# Patient Record
Sex: Female | Born: 1995
Health system: Southern US, Community
[De-identification: ages and names within clinical notes are randomized; demographics above are authoritative.]

## PROBLEM LIST (undated history)

## (undated) HISTORY — PX: TONSILLECTOMY: SUR1361

## (undated) HISTORY — PX: TYMPANOPLASTY: SHX33

## (undated) HISTORY — PX: TYMPANOSTOMY TUBE PLACEMENT: SHX32

## (undated) HISTORY — PX: ADENOIDECTOMY: SHX5191

---
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
## 2016-02-10 DIAGNOSIS — J09X2 Influenza due to identified novel influenza A virus with other respiratory manifestations: Secondary | ICD-10-CM | POA: Diagnosis not present

## 2016-02-29 DIAGNOSIS — Z01419 Encounter for gynecological examination (general) (routine) without abnormal findings: Secondary | ICD-10-CM | POA: Diagnosis not present

## 2016-02-29 DIAGNOSIS — Z6825 Body mass index (BMI) 25.0-25.9, adult: Secondary | ICD-10-CM | POA: Diagnosis not present

## 2016-07-21 DIAGNOSIS — N3000 Acute cystitis without hematuria: Secondary | ICD-10-CM | POA: Diagnosis not present

## 2016-07-22 DIAGNOSIS — N390 Urinary tract infection, site not specified: Secondary | ICD-10-CM | POA: Diagnosis not present

## 2016-08-11 DIAGNOSIS — Z309 Encounter for contraceptive management, unspecified: Secondary | ICD-10-CM | POA: Diagnosis not present

## 2016-08-11 DIAGNOSIS — B373 Candidiasis of vulva and vagina: Secondary | ICD-10-CM | POA: Diagnosis not present

## 2016-08-21 DIAGNOSIS — Z3202 Encounter for pregnancy test, result negative: Secondary | ICD-10-CM | POA: Diagnosis not present

## 2016-08-21 DIAGNOSIS — Z118 Encounter for screening for other infectious and parasitic diseases: Secondary | ICD-10-CM | POA: Diagnosis not present

## 2016-08-21 DIAGNOSIS — Z30430 Encounter for insertion of intrauterine contraceptive device: Secondary | ICD-10-CM | POA: Diagnosis not present

## 2016-08-21 DIAGNOSIS — Z113 Encounter for screening for infections with a predominantly sexual mode of transmission: Secondary | ICD-10-CM | POA: Diagnosis not present

## 2016-09-14 DIAGNOSIS — J029 Acute pharyngitis, unspecified: Secondary | ICD-10-CM | POA: Diagnosis not present

## 2016-09-22 DIAGNOSIS — Z30431 Encounter for routine checking of intrauterine contraceptive device: Secondary | ICD-10-CM | POA: Diagnosis not present

## 2018-04-14 ENCOUNTER — Emergency Department (HOSPITAL_COMMUNITY)
Admission: EM | Admit: 2018-04-14 | Discharge: 2018-04-14 | Disposition: A | Discharge status: Home / Self Care | Payer: BLUE CROSS/BLUE SHIELD | Attending: Emergency Medicine | Admitting: Emergency Medicine

## 2018-04-14 ENCOUNTER — Emergency Department (HOSPITAL_COMMUNITY): Payer: BLUE CROSS/BLUE SHIELD

## 2018-04-14 ENCOUNTER — Other Ambulatory Visit: Payer: Self-pay

## 2018-04-14 DIAGNOSIS — R42 Dizziness and giddiness: Secondary | ICD-10-CM | POA: Diagnosis not present

## 2018-04-14 DIAGNOSIS — R079 Chest pain, unspecified: Secondary | ICD-10-CM | POA: Diagnosis not present

## 2018-04-14 DIAGNOSIS — R1084 Generalized abdominal pain: Secondary | ICD-10-CM | POA: Diagnosis not present

## 2018-04-14 DIAGNOSIS — R1013 Epigastric pain: Secondary | ICD-10-CM | POA: Diagnosis not present

## 2018-04-14 DIAGNOSIS — R0602 Shortness of breath: Secondary | ICD-10-CM | POA: Diagnosis not present

## 2018-04-14 LAB — BASIC METABOLIC PANEL
Anion gap: 10 (ref 5–15)
BUN: 14 mg/dL (ref 6–20)
CO2: 20 mmol/L — ABNORMAL LOW (ref 22–32)
Calcium: 9 mg/dL (ref 8.9–10.3)
Chloride: 106 mmol/L (ref 98–111)
Creatinine, Ser: 0.9 mg/dL (ref 0.44–1.00)
GFR calc Af Amer: >60 mL/min (ref >60)
GFR calc non Af Amer: >60 mL/min (ref >60)
Glucose, Bld: 87 mg/dL (ref 70–99)
Potassium: 3.9 mmol/L (ref 3.5–5.1)
Sodium: 136 mmol/L (ref 135–145)

## 2018-04-14 LAB — URINALYSIS, ROUTINE W REFLEX MICROSCOPIC
Bilirubin Urine: NEGATIVE
Glucose, UA: NEGATIVE mg/dL
Ketones, ur: 80 mg/dL — AB
Leukocytes,Ua: NEGATIVE
Nitrite: POSITIVE — AB
Protein, ur: NEGATIVE mg/dL
Specific Gravity, Urine: 1.019 (ref 1.005–1.030)
pH: 6 (ref 5.0–8.0)

## 2018-04-14 LAB — I-STAT TROPONIN, ED: Troponin i, poc: 0.01 ng/mL (ref 0.00–0.08)

## 2018-04-14 LAB — CBC
HCT: 44.1 % (ref 36.0–46.0)
Hemoglobin: 14.6 g/dL (ref 12.0–15.0)
MCH: 29.1 pg (ref 26.0–34.0)
MCHC: 33.1 g/dL (ref 30.0–36.0)
MCV: 87.8 fL (ref 80.0–100.0)
Platelets: 247 10*3/uL (ref 150–400)
RBC: 5.02 MIL/uL (ref 3.87–5.11)
RDW: 12 % (ref 11.5–15.5)
WBC: 11.2 10*3/uL — ABNORMAL HIGH (ref 4.0–10.5)
nRBC: 0 % (ref 0.0–0.2)

## 2018-04-14 LAB — HEPATIC FUNCTION PANEL
ALT: 18 U/L (ref 0–44)
AST: 15 U/L (ref 15–41)
Albumin: 4.4 g/dL (ref 3.5–5.0)
Alkaline Phosphatase: 49 U/L (ref 38–126)
Bilirubin, Direct: 0.2 mg/dL (ref 0.0–0.2)
Indirect Bilirubin: 1.2 mg/dL — ABNORMAL HIGH (ref 0.3–0.9)
Total Bilirubin: 1.4 mg/dL — ABNORMAL HIGH (ref 0.3–1.2)
Total Protein: 6.9 g/dL (ref 6.5–8.1)

## 2018-04-14 LAB — I-STAT BETA HCG BLOOD, ED (MC, WL, AP ONLY): I-stat hCG, quantitative: <5 m[IU]/mL (ref <5)

## 2018-04-14 LAB — D-DIMER, QUANTITATIVE: D-Dimer, Quant: <0.27 ug/mL-FEU (ref 0.00–0.50)

## 2018-04-14 LAB — LIPASE, BLOOD: Lipase: 24 U/L (ref 11–51)

## 2018-04-14 MED ORDER — ONDANSETRON HCL 4 MG PO TABS: 4.0000 mg | ORAL_TABLET | Freq: Three times a day (TID) | ORAL | 0 refills | Status: DC | PRN

## 2018-04-14 MED ORDER — ALUM & MAG HYDROXIDE-SIMETH 200-200-20 MG/5ML PO SUSP
30.0000 mL | Freq: Once | ORAL | Status: AC
  Administered 2018-04-14: 30 mL via ORAL
  Filled 2018-04-14: qty 30

## 2018-04-14 MED ORDER — ONDANSETRON HCL 4 MG/2ML IJ SOLN
4.0000 mg | Freq: Once | INTRAMUSCULAR | Status: AC
  Administered 2018-04-14: 4 mg via INTRAVENOUS
  Filled 2018-04-14: qty 2

## 2018-04-14 MED ORDER — SODIUM CHLORIDE 0.9 % IV BOLUS
1000.0000 mL | Freq: Once | INTRAVENOUS | Status: AC
  Administered 2018-04-14: 1000 mL via INTRAVENOUS

## 2018-04-14 MED ORDER — KETOROLAC TROMETHAMINE 30 MG/ML IJ SOLN
30.0000 mg | Freq: Once | INTRAMUSCULAR | Status: AC
  Administered 2018-04-14: 30 mg via INTRAVENOUS
  Filled 2018-04-14: qty 1

## 2018-04-14 NOTE — ED Notes (Signed)
Patient verbalized understanding of discharge instructions. Opportunities for questioning and answers were provided. Armband removed by staff. Patient removed from ED.   

## 2018-04-14 NOTE — Discharge Instructions (Addendum)
You have been seen today for chest pain. Please read and follow all provided instructions.   1. Medications: zofran for nausea, usual home medications 2. Treatment: rest, drink plenty of fluids, stay home and avoid sick exposures 3. Follow Up: Please follow up with your primary doctor in 2-5 days for discussion of your diagnoses and further evaluation after today's visit; if you do not have a primary care doctor use the resource guide provided to find one; Please return to the ER for any new or worsening symptoms. Please obtain all of your results from medical records or have your doctors office obtain the results - share them with your doctor - you should be seen at your doctors office. Call today to arrange your follow up.   Take medications as prescribed. Please review all of the medicines and only take them if you do not have an allergy to them. Return to the emergency room for worsening condition or new concerning symptoms. Follow up with your regular doctor. If you don't have a regular doctor use one of the numbers below to establish a primary care doctor.  Please be aware that if you are taking birth control pills, taking other prescriptions, ESPECIALLY ANTIBIOTICS may make the birth control ineffective - if this is the case, either do not engage in sexual activity or use alternative methods of birth control such as condoms until you have finished the medicine and your family doctor says it is OK to restart them. If you are on a blood thinner such as COUMADIN, be aware that any other medicine that you take may cause the coumadin to either work too much, or not enough - you should have your coumadin level rechecked in next 7 days if this is the case.  ?  It is also a possibility that you have an allergic reaction to any of the medicines that you have been prescribed - Everybody reacts differently to medications and while MOST people have no trouble with most medicines, you may have a reaction such as  nausea, vomiting, rash, swelling, shortness of breath. If this is the case, please stop taking the medicine immediately and contact your physician.  ?  You should return to the ER if you develop severe or worsening symptoms.   Emergency Department Resource Guide 1) Find a Doctor and Pay Out of Pocket Although you won't have to find out who is covered by your insurance plan, it is a good idea to ask around and get recommendations. You will then need to call the office and see if the doctor you have chosen will accept you as a new patient and what types of options they offer for patients who are self-pay. Some doctors offer discounts or will set up payment plans for their patients who do not have insurance, but you will need to ask so you aren't surprised when you get to your appointment.  2) Contact Your Local Health Department Not all health departments have doctors that can see patients for sick visits, but many do, so it is worth a call to see if yours does. If you don't know where your local health department is, you can check in your phone book. The CDC also has a tool to help you locate your state's health department, and many state websites also have listings of all of their local health departments.  3) Find a Walk-in Clinic If your illness is not likely to be very severe or complicated, you may want to try a  walk in clinic. These are popping up all over the country in pharmacies, drugstores, and shopping centers. They're usually staffed by nurse practitioners or physician assistants that have been trained to treat common illnesses and complaints. They're usually fairly quick and inexpensive. However, if you have serious medical issues or chronic medical problems, these are probably not your best option.  No Primary Care Doctor: Call Health Connect at  [phone] - they can help you locate a primary care doctor that  accepts your insurance, provides certain services, etc. Physician Referral  Service- [phone]  Chronic Pain Problems: Organization         Address  Phone   Notes  [facility]  [phone] Patients need to be referred by their primary care doctor.   Medication Assistance: Organization         Address  Phone   Notes  [facility] Medication [facility] [address] [place], [place] [address] [phone] --Must be a resident of [facility] -- Must have NO insurance coverage whatsoever (no Medicaid/ Medicare, etc.) -- The pt. MUST have a primary care doctor that directs their care regularly and follows them in the community   [facility]  [phone]   [employer]  [phone]    Agencies that provide inexpensive medical care: Organization         Address  Phone   Notes  [place]  [phone]   [facility]    [phone]   [facility] [place], [place] [address] [phone]   [place] [address] [place] [phone]   [facility]    [phone]   [facility]    [phone]   [place] [address] [place] [address], [place] Phone:  [phone], Fax:  [phone] Hours of Operation:  9 am - 6 pm, M-F.  Also accepts Medicaid/Medicare and self-pay.  [facility] for [address], [address], [place] Phone: [phone], Fax: [phone]. Hours of Operation:  8:30 am - 5:30 pm, M-F.  Also accepts Medicaid and self-pay.  [facility] [address] [place] Phone: [phone]   [place], [place], [place] [phone], [address]. [phone] Mondays & Thursdays: 7-9 AM.  First 15 patients are seen on a first come, first serve basis.    [place] Providers:  Organization         Address  Phone   Notes  [facility] [address], [place] [phone] Also accepts self-pay patients.  [facility] [address], [place]  [phone]   [place], [address], [place] [phone]   [facility] [address], [place] [phone]   Lucianne Lei [address], [place]   [phone] Only accepts [place] [facility] [place] patients after they have their name applied to their card.   Self-Pay (no insurance) in [facility]:  Organization         Address  Phone   Notes  Sickle Cell Patients, [facility] [place] [phone]   [facility] [place] [phone]   [facility] [place]  [address], [address], [place] [phone]   [facility]/Dr. Osei-Bonsu  [address] [place] or [address], [place] [phone] Phone number for both [place] and [place] locations is the same.  [facility] [address] [place] [phone]   [facility] [address], [place] or [address] [phone]   [facility] [address] [place] [phone], phone; [phone], fax Sees patients 1st and 3rd Saturday of every month.  Must not qualify for public or private insurance (i.e. Medicaid, Medicare, [place] [employer] Choice, Veterans' Benefits)  Household income should be no more than 200% of the poverty level The clinic cannot treat you if you are pregnant or think you are pregnant  Sexually transmitted diseases are not treated at the clinic.

## 2018-04-14 NOTE — ED Notes (Signed)
Patient ambulated past the nurse's station and back to her room with steady gait and no complaints of dizziness or lightheadedness.

## 2018-04-14 NOTE — ED Triage Notes (Signed)
Pt here via EMS for evaluation of chest pain. Last night, pt had upset stomach, woke up feeling nauseas today with sternal chest pain. Two episodes of vomiting, with worsening chest, back, and side pain. Chest pain worse with movement and deep breath and causes shob. Pt dizzy when standing, orthostatic changes: 106/59 sitting HR 95 , 95/50 HR 140 standing.

## 2018-04-14 NOTE — ED Provider Notes (Signed)
[facility] EMERGENCY DEPARTMENT Provider Note   CSN: [id number] Arrival date & time: 04/14/18  1553    History   Chief Complaint Chief Complaint  Patient presents with  . Chest Pain    HPI Meghan Shelton is a 23 y.o. female with no significant past medical history presents with constant middle non radiating chest pain onset today at 11:30am. Patient reports she had mild epigastric abdominal pain yesterday after eating a barbeque. Patient reports she woke up with worse abdominal pain, nausea, and two episodes of vomiting. Patient reports chest pain started after vomiting. Patient states chest pain is worse with deep breaths and movement. Patient reports dizziness that she describes as lightheadedness with standing. Patient denies fever, chills, cough, congestion, rhinorrhea, or sick contacts. Patient denies diarrhea and states last BM was yesterday and it was normal. Patient reports she has a Mirena IUD. Patient denies recent travel, recent surgery, leg edema/pain, or hx of DVT/PE. Patient denies abdominal surgeries. Patient denies a cardiac history. Patient denies a family history of sudden cardiac death.  LMP at the beginning of February. Patient denies vaginal discharge or concerns for STIs. Patient reports occasional alcohol use, but denies tobacco or drug use.    HPI  No past medical history on file.  There are no active problems to display for this patient.   Past Surgical History:  Procedure Laterality Date  . ADENOIDECTOMY    . TONSILLECTOMY    . TYMPANOPLASTY    . TYMPANOSTOMY TUBE PLACEMENT       OB History   No obstetric history on file.      Home Medications    Prior to Admission medications   Medication Sig Start Date End Date Taking? Authorizing Provider  ondansetron (ZOFRAN) 4 MG tablet Take 1 tablet (4 mg total) by mouth every 8 (eight) hours as needed for nausea or vomiting. 04/14/18   Leretha Dykes, PA-C    Family History No  family history on file.  Social History Social History   Tobacco Use  . Smoking status: Never Smoker  Substance Use Topics  . Alcohol use: Not on file  . Drug use: Not on file     Allergies   Patient has no known allergies.   Review of Systems Review of Systems  Constitutional: Negative for activity change, appetite change, chills, diaphoresis, fatigue, fever and unexpected weight change.  HENT: Negative for congestion and rhinorrhea.   Respiratory: Negative for cough, chest tightness, shortness of breath and wheezing.   Cardiovascular: Positive for chest pain. Negative for palpitations and leg swelling.  Gastrointestinal: Positive for abdominal pain, nausea and vomiting. Negative for constipation and diarrhea.  Endocrine: Negative for cold intolerance and heat intolerance.  Genitourinary: Negative for dysuria, vaginal bleeding and vaginal discharge.  Musculoskeletal: Negative for back pain.  Skin: Negative for rash.  Allergic/Immunologic: Negative for immunocompromised state.  Neurological: Positive for dizziness and light-headedness. Negative for syncope, weakness, numbness and headaches.  Psychiatric/Behavioral: Negative for agitation and behavioral problems. The patient is not nervous/anxious.      Physical Exam Updated Vital Signs BP (!) 112/49   Pulse 93   Temp 99.5 F (37.5 C) (Oral)   Resp 13   Ht  (1.6 m)   Wt 60.3 kg   SpO2 98%   BMI 23.56 kg/m   Physical Exam Vitals signs and nursing note reviewed.  Constitutional:      General: She is not in acute distress.    Appearance: She  is well-developed. She is not diaphoretic.  HENT:     Head: Normocephalic and atraumatic.  Neck:     Musculoskeletal: Normal range of motion and neck supple.     Vascular: No JVD.  Cardiovascular:     Rate and Rhythm: Normal rate and regular rhythm.     Pulses: Normal pulses.          Radial pulses are 2+ on the right side and 2+ on the left side.       Dorsalis pedis  pulses are 2+ on the right side and 2+ on the left side.     Heart sounds: Normal heart sounds. No murmur. No friction rub. No gallop.   Pulmonary:     Effort: Pulmonary effort is normal. No respiratory distress.     Breath sounds: Normal breath sounds. No wheezing, rhonchi or rales.  Chest:     Chest wall: No tenderness.  Abdominal:     General: Abdomen is flat.     Palpations: Abdomen is soft.     Tenderness: There is no abdominal tenderness. There is no right CVA tenderness, left CVA tenderness, guarding or rebound. Negative signs include Murphy's sign.  Musculoskeletal: Normal range of motion.     Right lower leg: She exhibits no tenderness. No edema.     Left lower leg: She exhibits no tenderness. No edema.  Skin:    General: Skin is warm.     Capillary Refill: Capillary refill takes less than 2 seconds.     Coloration: Skin is not pale.     Findings: No rash.  Neurological:     Mental Status: She is alert and oriented to person, place, and time.   Mental Status:  Alert, oriented, thought content appropriate, able to give a coherent history. Speech fluent without evidence of aphasia. Able to follow 2 step commands without difficulty.  Cranial Nerves:  II:  Peripheral visual fields grossly normal, pupils equal, round, reactive to light III,IV, VI: ptosis not present, extra-ocular motions intact bilaterally  V,VII: smile symmetric, facial light touch sensation equal VIII: hearing grossly normal to voice  X: uvula elevates symmetrically  XI: bilateral shoulder shrug symmetric and strong XII: midline tongue extension without fassiculations Motor:  Normal tone. 5/5 in upper and lower extremities bilaterally including strong and equal grip strength and dorsiflexion/plantar flexion Sensory: light touch normal in all extremities.  Deep Tendon Reflexes: 2+ and symmetric in the biceps and patella Cerebellar: normal finger-to-nose with bilateral upper extremities Gait: normal gait and  balance.  CV: distal pulses palpable throughout    ED Treatments / Results  Labs (all labs ordered are listed, but only abnormal results are displayed) Labs Reviewed  BASIC METABOLIC PANEL - Abnormal; Notable for the following components:      Result Value   CO2 20 (*)    All other components within normal limits  CBC - Abnormal; Notable for the following components:   WBC 11.2 (*)    All other components within normal limits  URINALYSIS, ROUTINE W REFLEX MICROSCOPIC - Abnormal; Notable for the following components:   APPearance HAZY (*)    Hgb urine dipstick MODERATE (*)    Ketones, ur 80 (*)    Nitrite POSITIVE (*)    Bacteria, UA RARE (*)    All other components within normal limits  HEPATIC FUNCTION PANEL - Abnormal; Notable for the following components:   Total Bilirubin 1.4 (*)    Indirect Bilirubin 1.2 (*)    All other  components within normal limits  URINE CULTURE  D-DIMER, QUANTITATIVE (NOT AT [facility])  LIPASE, BLOOD  I-STAT TROPONIN, ED  I-STAT BETA HCG BLOOD, ED ([facility], [facility], [facility] ONLY)    EKG EKG Interpretation  Date/Time:  Sunday April 14 2018 16:00:33 EDT Ventricular Rate:  89 PR Interval:    QRS Duration: 84 QT Interval:  367 QTC Calculation: 447 R Axis:   95 Text Interpretation:  Sinus rhythm Biatrial enlargement Consider right ventricular hypertrophy Confirmed by Tilden Fossa [phone]) on 04/14/2018 5:28:23 PM   Radiology Dg Chest 2 View  Result Date: 04/14/2018 CLINICAL DATA:  Chest pain with some shortness of breath for a few days. EXAM: CHEST - 2 VIEW COMPARISON:  None. FINDINGS: Cardiomediastinal silhouette is within normal limits in size and configuration. Lungs are clear. Lung volumes are normal. No evidence of pneumonia. No pleural effusion. No pneumothorax seen. Osseous structures about the chest are unremarkable. IMPRESSION: Normal chest x-ray.  No evidence of pneumonia. Electronically Signed   By: Bary Richard M.D.   On: 04/14/2018 17:03     Procedures Procedures (including critical care time)  Medications Ordered in ED Medications  ondansetron (ZOFRAN) injection 4 mg (4 mg Intravenous Given 04/14/18 1644)  sodium chloride 0.9 % bolus 1,000 mL (0 mLs Intravenous Stopped 04/14/18 1728)  alum & mag hydroxide-simeth (MAALOX/MYLANTA) 200-200-20 MG/5ML suspension 30 mL (30 mLs Oral Given 04/14/18 1727)  ketorolac (TORADOL) 30 MG/ML injection 30 mg (30 mg Intravenous Given 04/14/18 1922)     Initial Impression / Assessment and Plan / ED Course  I have reviewed the triage vital signs and the nursing notes.  Pertinent labs & imaging results that were available during my care of the patient were reviewed by me and considered in my medical decision making (see chart for details).  Clinical Course as of Apr 13 2025  Sun Apr 14, 2018  1706 No cardiopulmonary disease noted on CXR.  DG Chest 2 View [AH]  1813 UA reveals Hgb, ketones, nitrites, and rare bacteria. Will send for a urine culture.  Urinalysis, Routine w reflex microscopic(!) [AH]  1814 WBCs elevated at 11.2.  WBC(!): 11.2 [AH]  1854 Patient reports abdominal pain and chest pain have improved.    [AH]  2009 Patient was able to ambulate without difficulty around the ER. Patient denies dizziness and states symptoms have improved.   [AH]    Clinical Course User Index [AH] Leretha Dykes, PA-C      Patient is to be discharged with recommendation to follow up with PCP in regards to today's hospital visit. Chest pain is not likely of cardiac or pulmonary etiology d/t presentation, d dimer negative, VSS, no tracheal deviation, no JVD or new murmur, RRR, breath sounds equal bilaterally, EKG without acute abnormalities, negative troponin, and negative CXR. Pt has been advised to return to the ED if CP becomes exertional, associated with diaphoresis or nausea, radiates to left jaw/arm, worsens or becomes concerning in any way. Pt appears reliable for follow up and is agreeable to  discharge.   Patient is nontoxic, nonseptic appearing, in no apparent distress.  Patient's pain and other symptoms adequately managed in emergency department.  Fluid bolus given.  Labs, imaging and vitals reviewed.  Patient does not meet the SIRS or Sepsis criteria.  On repeat exam patient does not have a surgical abdomin and there are no peritoneal signs. Patient does not have any tenderness on exam. No indication of appendicitis, bowel obstruction, bowel perforation, cholecystitis, diverticulitis, PID or ectopic  pregnancy.  Suspect patient has a viral illness. Patient discharged home with symptomatic treatment and given strict instructions for follow-up with their primary care physician.  I have also discussed reasons to return immediately to the ER.  Patient expresses understanding and agrees with plan.   Case has been discussed with Dr. Madilyn Hook who agrees with the above plan to discharge.   Final Clinical Impressions(s) / ED Diagnoses   Final diagnoses:  Nonspecific chest pain  Epigastric pain    ED Discharge Orders         Ordered    ondansetron (ZOFRAN) 4 MG tablet  Every 8 hours PRN     04/14/18 2026           Leretha Dykes, [place] 04/14/18 2028    Tilden Fossa, MD 04/14/18 [phone]

## 2018-04-14 NOTE — ED Notes (Signed)
Called lab about adding on Hepatic function panel from previously sent light green top. Told I did not need to send the order requisition to the lab.

## 2018-04-17 LAB — URINE CULTURE: Culture: >=100000 — AB

## 2018-04-18 ENCOUNTER — Telehealth: Payer: Self-pay | Admitting: *Deleted

## 2018-04-18 NOTE — Telephone Encounter (Signed)
Post ED Visit - Positive Culture Follow-up  Culture report reviewed by antimicrobial stewardship pharmacist: [facility] []  Enzo Bi, Pharm.D. []  Celedonio Miyamoto, Pharm.D., BCPS AQ-ID []  Garvin Fila, Pharm.D., BCPS []  Georgina Pillion, Pharm.D., BCPS []  [place], [address].D., BCPS, AAHIVP []  Estella Husk, Pharm.D., BCPS, AAHIVP []  Lysle Pearl, PharmD, BCPS []  Phillips Climes, PharmD, BCPS [x]  Agapito Games, PharmD, BCPS []  Verlan Friends, PharmD []  Mervyn Gay, PharmD, BCPS []  Vinnie Level, PharmD  [facility] []  Len Childs, PharmD []  Greer Pickerel, PharmD []  Adalberto Cole, PharmD []  Perlie Gold, Rph []  Lonell Face) Jean Rosenthal, PharmD []  Earl Many, PharmD []  Junita Push, PharmD []  Dorna Leitz, PharmD []  Terrilee Files, PharmD []  Lynann Beaver, PharmD []  Keturah Barre, PharmD []  Loralee Pacas, PharmD []  Bernadene Person, PharmD   Positive urine culture GU symptoms negative, UA with +nitrite, otherwise negative and no further patient follow-up is required at this time.  Virl [facility] 04/18/2018, 8:04 AM

## 2018-09-05 ENCOUNTER — Ambulatory Visit (INDEPENDENT_AMBULATORY_CARE_PROVIDER_SITE_OTHER): Payer: BC Managed Care – PPO | Admitting: Allergy & Immunology

## 2018-09-05 ENCOUNTER — Other Ambulatory Visit: Payer: Self-pay

## 2018-09-05 ENCOUNTER — Encounter: Payer: Self-pay | Admitting: Allergy & Immunology

## 2018-09-05 VITALS — BP 98/60 | HR 97 | Temp 98.8°F | Resp 16 | Ht 63.0 in | Wt 132.2 lb

## 2018-09-05 DIAGNOSIS — J301 Allergic rhinitis due to pollen: Secondary | ICD-10-CM

## 2018-09-05 DIAGNOSIS — L508 Other urticaria: Secondary | ICD-10-CM

## 2018-09-05 NOTE — Patient Instructions (Addendum)
1. Chronic urticaria - Testing today was positive to grasses as well as one outdoor mold. - The grasses can certainly explain the hives, as they typically occur when you are outdoors. - However, we are going to get some blood work to make sure there are no other causes of your hives. - We will get some labs to rule out serious causes of hives: complete blood count, tryptase level, chronic urticaria panel, CMP, ESR, and CRP. We are also going to get an alpha gal panel and thyroid studies.  - Your history does not have any "red flags" such as fevers, joint pains, or permanent skin changes that would be concerning for a more serious cause of hives.  - Chronic hives are often times a self limited process and will "burn themselves out" over 6-12 months, although this is not always the case.  - In the meantime, start suppressive dosing of antihistamines:   - Morning: Zyrtec (cetirizine) 87m (one tablet) EVERY DAY  - Evening: Zyrtec (cetirizine) 156m(one tablet) IF NEEDED - You can change this dosing at home, decreasing the dose as needed or increasing the dosing as needed.  - If you are not tolerating the medications or are tired of taking them every day, we can start treatment with a monthly injectable medication called Xolair.   2. Return in about 8 weeks (around 10/31/2018). This can be an in-person, a virtual Webex or a telephone follow up visit.   Please inform usKoreaf any Emergency Department visits, hospitalizations, or changes in symptoms. Call usKoreaefore going to the ED for breathing or allergy symptoms since we might be able to fit you in for a sick visit. Feel free to contact usKoreanytime with any questions, problems, or concerns.  It was a pleasure to meet you today!  Websites that have reliable patient information: 1. American Academy of Asthma, Allergy, and Immunology: www.aaaai.org 2. Food Allergy Research and Education (FARE): foodallergy.org 3. Mothers of Asthmatics:  http://www.asthmacommunitynetwork.org 4. American College of Allergy, Asthma, and Immunology: www.acaai.org  "Like" usKorean Facebook and Instagram for our latest updates!      Make sure you are registered to vote! If you have moved or changed any of your contact information, you will need to get this updated before voting!  In some cases, you MAY be able to register to vote online: [email or web address].[email or web address]  Voter ID laws are NOT going into effect for the General Election in November 2020! DO NOT let this stop you from exercising your right to vote!   Absentee voting is the SAFEST way to vote during the coronavirus pandemic!   Download and print an absentee ballot request form at rebrand.ly/GCO-Ballot-Request or you can scan the QR code below with your smart phone:      More information on absentee ballots can be found here: https://rebrand.ly/GCO-Absentee  Reducing Pollen Exposure  The American Academy of Allergy, Asthma and Immunology suggests the following steps to reduce your exposure to pollen during allergy seasons.    1. Do not hang sheets or clothing out to dry; pollen may collect on these items. 2. Do not mow lawns or spend time around freshly cut grass; mowing stirs up pollen. 3. Keep windows closed at night.  Keep car windows closed while driving. 4. Minimize morning activities outdoors, a time when pollen counts are usually at their highest. 5. Stay indoors as much as possible when pollen counts or humidity is high and on windy days when pollen tends to  remain in the air longer. 6. Use air conditioning when possible.  Many air conditioners have filters that trap the pollen spores. 7. Use a HEPA room air filter to remove pollen form the indoor air you breathe.

## 2018-09-05 NOTE — Progress Notes (Signed)
NEW PATIENT  Date of Service/Encounter:  09/05/18  Referring provider: Lennie Hummer, MD   Assessment:   Chronic urticaria  Plan/Recommendations:   1. Chronic urticaria - Testing today was positive to grasses as well as one outdoor mold. - The grasses can certainly explain the hives, as they typically occur when you are outdoors. - However, we are going to get some blood work to make sure there are no other causes of your hives. - We will get some labs to rule out serious causes of hives: complete blood count, tryptase level, chronic urticaria panel, CMP, ESR, and CRP. We are also going to get an alpha gal panel and thyroid studies.  - Your history does not have any "red flags" such as fevers, joint pains, or permanent skin changes that would be concerning for a more serious cause of hives.  - Chronic hives are often times a self limited process and will "burn themselves out" over 6-12 months, although this is not always the case.  - In the meantime, start suppressive dosing of antihistamines:   - Morning: Zyrtec (cetirizine) 47m (one tablet) EVERY DAY  - Evening: Zyrtec (cetirizine) 193m(one tablet) IF NEEDED - You can change this dosing at home, decreasing the dose as needed or increasing the dosing as needed.  - If you are not tolerating the medications or are tired of taking them every day, we can start treatment with a monthly injectable medication called Xolair.   2. Return in about 8 weeks (around 10/31/2018). This can be an in-person, a virtual Webex or a telephone follow up visit.   Subjective:   Meghan Shelton a [age].o. female presenting today for evaluation of  Chief Complaint  Patient presents with  . Angioedema  . Urticaria    Meghan CHARBONNEAUas a history of the following: Patient Active Problem List   Diagnosis Date Noted  . Chronic urticaria 09/05/2018  . Seasonal allergic rhinitis due to pollen 09/05/2018    History obtained from: chart review  and patient.  Meghan Sonsas referred by LoLennie HummerMD.     Meghan Shelton a [age].o. female presenting for an evaluation of urticaria. Her episode in March was after running outdoors. She developed hives near the end and then had some isolated hives over her body. She called EMS and her exam was normal other than the hives. She got Benadryl with improvement.  On Sunday, she got up in the morning and then at 6:30am she took the dogs out. Then she came in and went back to bed and develops hives her back and sides. She was outdoors only for 2-3 minutes.   [place] December [place] but she has had dogs in the past. Most episodes are associated with runs outdoors. She never had this problem one year ago but these just started in March 2020.   She did have some throat closure on Sunday. Otherwise no involvement of her throat. Her mother and sister have Hashimoto's thyroiditis. She does not have any thyroid issues at this time. She thinks that she has been tested in the past and it has been normal. She denies weight gain or weight loss and has no problems with excessive sweating.   She has never been tested for allergies. She does not get sinus infections often at all. She did play sports in high school. She is active outdoors and spends  a lot of time doing various activities outdoors. She does report some postnasal drip as well, treated with the occasional antihistamine.   Otherwise, there is no history of other atopic diseases, including food allergies, drug allergies, stinging insect allergies, eczema or contact dermatitis. There is no significant infectious history. Vaccinations are up to date.    Past Medical History: Patient Active Problem List   Diagnosis Date Noted  . Chronic urticaria 09/05/2018  . Seasonal allergic rhinitis due to pollen 09/05/2018    Medication List:  Allergies as of 09/05/2018   No Known Allergies      Medication List       Accurate as of September 05, 2018  8:57 PM. If you have any questions, ask your nurse or doctor.        STOP taking these medications   ondansetron 4 MG tablet Commonly known as: ZOFRAN Stopped by: [name], MD     TAKE these medications   diphenhydrAMINE 25 MG tablet Commonly known as: BENADRYL Take 25 mg by mouth every 6 (six) hours as needed.       Birth History: non-contributory  Developmental History: non-contributory  Past Surgical History: Past Surgical History:  Procedure Laterality Date  . ADENOIDECTOMY    . TONSILLECTOMY    . TYMPANOPLASTY    . TYMPANOSTOMY TUBE PLACEMENT       Family History: History reviewed. No pertinent family history.   Social History: Meghan Shelton lives at home with her family. There is one dog in the home. She was getting a degree in speech pathology, but she decided that she did not like it, therefore she quit temporarily. 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     Review of Systems  Constitutional: Negative.  Negative for fever, malaise/fatigue and weight loss.  HENT: Negative.  Negative for congestion, ear discharge and ear pain.        Positive for mild postnasal drip.  Eyes: Negative for pain, discharge and redness.  Respiratory: Negative for cough, sputum production, shortness of breath and wheezing.   Cardiovascular: Negative.  Negative for chest pain and palpitations.  Gastrointestinal: Negative for abdominal pain, heartburn, nausea and vomiting.  Skin: Positive for itching and rash.  Neurological: Negative for dizziness and headaches.  Endo/Heme/Allergies: Negative for environmental allergies. Does not bruise/bleed easily.       Objective:   Blood pressure 98/60, pulse 97, temperature 98.8 F (37.1 C), temperature source Temporal, resp. rate 16, height 5' 3"  (1.6 m), weight 132 lb 3.2 oz (60 kg), SpO2 97 %. Body mass index is 23.42 kg/m.   Physical Exam:    Physical Exam  Constitutional: She appears well-developed.  Very pleasant female. Cooperative with the exam.   HENT:  Head: Normocephalic and atraumatic.  Right Ear: Tympanic membrane, external ear and ear canal normal. No drainage, swelling or tenderness. Tympanic membrane is not injected, not scarred, not erythematous, not retracted and not bulging.  Left Ear: Tympanic membrane, external ear and ear canal normal. No drainage, swelling or tenderness. Tympanic membrane is not injected, not scarred, not erythematous, not retracted and not bulging.  Nose: Mucosal edema and rhinorrhea present. No nasal deformity or septal deviation. No epistaxis. Right sinus exhibits no maxillary sinus tenderness and no frontal sinus tenderness. Left sinus exhibits no maxillary sinus tenderness and no frontal sinus tenderness.  Mouth/Throat: Uvula is midline and oropharynx is clear and moist. Mucous membranes are not pale and not dry.  Eyes: Pupils are equal,  round, and reactive to light. Conjunctivae and EOM are normal. Right eye exhibits no chemosis and no discharge. Left eye exhibits no chemosis and no discharge. Right conjunctiva is not injected. Left conjunctiva is not injected.  Cardiovascular: Normal rate, regular rhythm and normal heart sounds.  Respiratory: Effort normal and breath sounds normal. No accessory muscle usage. No tachypnea. No respiratory distress. She has no wheezes. She has no rhonchi. She has no rales. She exhibits no tenderness.  No increased work of breathing.   GI: There is no abdominal tenderness. There is no rebound and no guarding.  Lymphadenopathy:       Head (right side): No submandibular, no tonsillar and no occipital adenopathy present.       Head (left side): No submandibular, no tonsillar and no occipital adenopathy present.    She has no cervical adenopathy.  Neurological: She is alert.  Skin: No abrasion, no petechiae and no rash noted. Rash is not papular, not vesicular and  not urticarial. No erythema. No pallor.  No dermatographism noted. There are no urticarial lesions appreciated today, although she does have some flat red macules present in some places.   Psychiatric: She has a normal mood and affect.     Diagnostic studies:     Allergy Studies:    Airborne Adult Perc - 09/05/18 1430    Time Antigen Placed  0230    Allergen Manufacturer  Lavella Hammock    Location  Back    Number of Test  59    1. Control-Buffer 50% Glycerol  Negative    2. Control-Histamine 1 mg/ml  2+    3. Albumin saline  Negative    4. [place]  3+    5. [place]  3+    6. Johnson  3+    7. Kentucky Blue  3+    8. Meadow Fescue  3+    9. Perennial Rye  3+    10. Sweet Vernal  3+    11. Timothy  3+    12. Cocklebur  Negative    13. Burweed Marshelder  Negative    14. Ragweed, short  Negative    15. Ragweed, Giant  Negative    16. Plantain,  English  Negative    17. Lamb's Quarters  Negative    18. Sheep Sorrell  Negative    19. Rough Pigweed  Negative    20. Marsh Elder, Rough  Negative    21. Mugwort, Common  Negative    22. Ash mix  Negative    23. Birch mix  Negative    24. Beech American  Negative    25. Box, Elder  Negative    26. Cedar, red  Negative    27. Cottonwood, [place]  Negative    28. Elm mix  Negative    29. Hickory mix  Negative    30. Maple mix  Negative    31. Oak, [place] mix  Negative    32. Pecan Pollen  Negative    33. Pine mix  Negative    34. Sycamore Eastern  Negative    35. Newport, Black Pollen  Negative    36. Alternaria alternata  2+    37. Cladosporium Herbarum  Negative    38. Aspergillus mix  Negative    39. Penicillium mix  Negative    40. Bipolaris sorokiniana (Helminthosporium)  Negative    41. Drechslera spicifera (Curvularia)  Negative    42. Mucor plumbeus  Negative  43. Fusarium moniliforme  Negative    44. Aureobasidium pullulans (pullulara)  Negative    45. Rhizopus oryzae  Negative    46. Botrytis cinera  Negative    47.  Epicoccum nigrum  Negative    48. Phoma betae  Negative    49. Candida Albicans  Negative    50. Trichophyton mentagrophytes  Negative    51. Mite, D Farinae  5,000 AU/ml  Negative    52. Mite, D Pteronyssinus  5,000 AU/ml  Negative    53. Cat Hair 10,000 BAU/ml  Negative    54.  Dog Epithelia  Negative    55. Mixed Feathers  Negative    56. Horse Epithelia  Negative    57. Cockroach, German  Negative    58. Mouse  Negative    59. Tobacco Leaf  Negative       Allergy testing results were read and interpreted by myself, documented by clinical staff.         Salvatore Marvel, MD [facility]

## 2018-09-06 ENCOUNTER — Ambulatory Visit: Payer: BLUE CROSS/BLUE SHIELD | Admitting: Allergy

## 2018-09-11 LAB — CMP14+EGFR
ALT: 9 IU/L (ref 0–32)
AST: 11 IU/L (ref 0–40)
Albumin/Globulin Ratio: 2.7 — ABNORMAL HIGH (ref 1.2–2.2)
Albumin: 5.1 g/dL — ABNORMAL HIGH (ref 3.9–5.0)
Alkaline Phosphatase: 47 IU/L (ref 39–117)
BUN/Creatinine Ratio: 17 (ref 9–23)
BUN: 15 mg/dL (ref 6–20)
Bilirubin Total: 0.7 mg/dL (ref 0.0–1.2)
CO2: 22 mmol/L (ref 20–29)
Calcium: 10.1 mg/dL (ref 8.7–10.2)
Chloride: 102 mmol/L (ref 96–106)
Creatinine, Ser: 0.87 mg/dL (ref 0.57–1.00)
GFR calc Af Amer: 110 mL/min/{1.73_m2} (ref >59)
GFR calc non Af Amer: 96 mL/min/{1.73_m2} (ref >59)
Globulin, Total: 1.9 g/dL (ref 1.5–4.5)
Glucose: 89 mg/dL (ref 65–99)
Potassium: 4.6 mmol/L (ref 3.5–5.2)
Sodium: 141 mmol/L (ref 134–144)
Total Protein: 7 g/dL (ref 6.0–8.5)

## 2018-09-11 LAB — ALPHA-GAL PANEL
Alpha Gal IgE*: <0.1 kU/L (ref <0.10)
Beef (Bos spp) IgE: <0.1 kU/L (ref <0.35)
Class Interpretation: 0
Class Interpretation: 0
Class Interpretation: 0
Lamb/Mutton (Ovis spp) IgE: <0.1 kU/L (ref <0.35)
Pork (Sus spp) IgE: <0.1 kU/L (ref <0.35)

## 2018-09-11 LAB — THYROID ANTIBODIES
Thyroglobulin Antibody: <1 IU/mL (ref 0.0–0.9)
Thyroperoxidase Ab SerPl-aCnc: <9 IU/mL (ref 0–34)

## 2018-09-11 LAB — TRYPTASE: Tryptase: 5.2 ug/L (ref 2.2–13.2)

## 2018-09-11 LAB — C-REACTIVE PROTEIN: CRP: <1 mg/L (ref 0–10)

## 2018-09-11 LAB — SEDIMENTATION RATE: Sed Rate: 3 mm/hr (ref 0–32)

## 2018-09-11 LAB — CHRONIC URTICARIA: cu index: <2.6 (ref <10)

## 2018-09-11 LAB — ANA W/REFLEX IF POSITIVE: Anti Nuclear Antibody (ANA): NEGATIVE

## 2018-10-31 ENCOUNTER — Ambulatory Visit: Payer: BC Managed Care – PPO | Admitting: Allergy & Immunology

## 2018-12-24 ENCOUNTER — Other Ambulatory Visit: Payer: Self-pay

## 2018-12-24 DIAGNOSIS — Z20822 Contact with and (suspected) exposure to covid-19: Secondary | ICD-10-CM

## 2018-12-27 LAB — NOVEL CORONAVIRUS, NAA: SARS-CoV-2, NAA: NOT DETECTED

## 2019-03-04 DIAGNOSIS — Z03818 Encounter for observation for suspected exposure to other biological agents ruled out: Secondary | ICD-10-CM | POA: Diagnosis not present

## 2019-03-04 DIAGNOSIS — Z20828 Contact with and (suspected) exposure to other viral communicable diseases: Secondary | ICD-10-CM | POA: Diagnosis not present

## 2019-05-20 DIAGNOSIS — N6320 Unspecified lump in the left breast, unspecified quadrant: Secondary | ICD-10-CM | POA: Diagnosis not present

## 2019-05-29 DIAGNOSIS — N6489 Other specified disorders of breast: Secondary | ICD-10-CM | POA: Diagnosis not present

## 2019-06-04 DIAGNOSIS — Z01419 Encounter for gynecological examination (general) (routine) without abnormal findings: Secondary | ICD-10-CM | POA: Diagnosis not present

## 2019-06-04 DIAGNOSIS — Z6825 Body mass index (BMI) 25.0-25.9, adult: Secondary | ICD-10-CM | POA: Diagnosis not present

## 2019-12-18 DIAGNOSIS — T782XXA Anaphylactic shock, unspecified, initial encounter: Secondary | ICD-10-CM | POA: Diagnosis not present

## 2019-12-18 DIAGNOSIS — L509 Urticaria, unspecified: Secondary | ICD-10-CM | POA: Diagnosis not present

## 2019-12-18 DIAGNOSIS — R0902 Hypoxemia: Secondary | ICD-10-CM | POA: Diagnosis not present

## 2019-12-18 DIAGNOSIS — T7840XA Allergy, unspecified, initial encounter: Secondary | ICD-10-CM | POA: Diagnosis not present

## 2020-04-05 ENCOUNTER — Emergency Department (HOSPITAL_COMMUNITY)
Admission: EM | Admit: 2020-04-05 | Discharge: 2020-04-05 | Disposition: A | Discharge status: Home / Self Care | Payer: BC Managed Care – PPO | Source: Home / Self Care | Attending: Emergency Medicine | Admitting: Emergency Medicine

## 2020-04-05 DIAGNOSIS — R21 Rash and other nonspecific skin eruption: Secondary | ICD-10-CM | POA: Insufficient documentation

## 2020-04-05 DIAGNOSIS — X58XXXA Exposure to other specified factors, initial encounter: Secondary | ICD-10-CM | POA: Insufficient documentation

## 2020-04-05 DIAGNOSIS — T7840XA Allergy, unspecified, initial encounter: Secondary | ICD-10-CM

## 2020-04-05 DIAGNOSIS — T782XXA Anaphylactic shock, unspecified, initial encounter: Secondary | ICD-10-CM | POA: Diagnosis not present

## 2020-04-05 DIAGNOSIS — R197 Diarrhea, unspecified: Secondary | ICD-10-CM | POA: Insufficient documentation

## 2020-04-05 DIAGNOSIS — T7809XA Anaphylactic reaction due to other food products, initial encounter: Secondary | ICD-10-CM | POA: Diagnosis not present

## 2020-04-05 DIAGNOSIS — R110 Nausea: Secondary | ICD-10-CM | POA: Diagnosis not present

## 2020-04-05 DIAGNOSIS — Z20822 Contact with and (suspected) exposure to covid-19: Secondary | ICD-10-CM | POA: Diagnosis not present

## 2020-04-05 LAB — CBC WITH DIFFERENTIAL/PLATELET
Abs Immature Granulocytes: 0.04 10*3/uL (ref 0.00–0.07)
Basophils Absolute: 0 10*3/uL (ref 0.0–0.1)
Basophils Relative: 0 %
Eosinophils Absolute: 0.1 10*3/uL (ref 0.0–0.5)
Eosinophils Relative: 1 %
HCT: 45.6 % (ref 36.0–46.0)
Hemoglobin: 15 g/dL (ref 12.0–15.0)
Immature Granulocytes: 0 %
Lymphocytes Relative: 6 %
Lymphs Abs: 0.6 10*3/uL — ABNORMAL LOW (ref 0.7–4.0)
MCH: 30.2 pg (ref 26.0–34.0)
MCHC: 32.9 g/dL (ref 30.0–36.0)
MCV: 91.9 fL (ref 80.0–100.0)
Monocytes Absolute: 0.4 10*3/uL (ref 0.1–1.0)
Monocytes Relative: 4 %
Neutro Abs: 8.5 10*3/uL — ABNORMAL HIGH (ref 1.7–7.7)
Neutrophils Relative %: 89 %
Platelets: 207 10*3/uL (ref 150–400)
RBC: 4.96 MIL/uL (ref 3.87–5.11)
RDW: 14.1 % (ref 11.5–15.5)
WBC: 9.5 10*3/uL (ref 4.0–10.5)
nRBC: 0 % (ref 0.0–0.2)

## 2020-04-05 LAB — COMPREHENSIVE METABOLIC PANEL
ALT: 28 U/L (ref 0–44)
AST: 31 U/L (ref 15–41)
Albumin: 4 g/dL (ref 3.5–5.0)
Alkaline Phosphatase: 38 U/L (ref 38–126)
Anion gap: 8 (ref 5–15)
BUN: 14 mg/dL (ref 6–20)
CO2: 23 mmol/L (ref 22–32)
Calcium: 8.9 mg/dL (ref 8.9–10.3)
Chloride: 107 mmol/L (ref 98–111)
Creatinine, Ser: 1.1 mg/dL — ABNORMAL HIGH (ref 0.44–1.00)
GFR, Estimated: >60 mL/min (ref >60)
Glucose, Bld: 164 mg/dL — ABNORMAL HIGH (ref 70–99)
Potassium: 3.5 mmol/L (ref 3.5–5.1)
Sodium: 138 mmol/L (ref 135–145)
Total Bilirubin: 1 mg/dL (ref 0.3–1.2)
Total Protein: 6 g/dL — ABNORMAL LOW (ref 6.5–8.1)

## 2020-04-05 LAB — MAGNESIUM: Magnesium: 1.7 mg/dL (ref 1.7–2.4)

## 2020-04-05 MED ORDER — FAMOTIDINE IN NACL 20-0.9 MG/50ML-% IV SOLN
20.0000 mg | Freq: Once | INTRAVENOUS | Status: AC
  Administered 2020-04-05: 20 mg via INTRAVENOUS
  Filled 2020-04-05: qty 50

## 2020-04-05 MED ORDER — LACTATED RINGERS IV BOLUS
1000.0000 mL | Freq: Once | INTRAVENOUS | Status: AC
  Administered 2020-04-05: 1000 mL via INTRAVENOUS

## 2020-04-05 MED ORDER — METHYLPREDNISOLONE SODIUM SUCC 125 MG IJ SOLR
125.0000 mg | Freq: Once | INTRAMUSCULAR | Status: AC
  Administered 2020-04-05: 125 mg via INTRAVENOUS
  Filled 2020-04-05: qty 2

## 2020-04-05 MED ORDER — ONDANSETRON HCL 4 MG/2ML IJ SOLN
4.0000 mg | Freq: Once | INTRAMUSCULAR | Status: AC
  Administered 2020-04-05: 4 mg via INTRAVENOUS
  Filled 2020-04-05: qty 2

## 2020-04-05 NOTE — ED Triage Notes (Signed)
Pt arrived via ems due to allergic reaction. Pt states she started having shob and hives around 615pm. Pt took at home epi around 620pm. Pt no longer feels sob. Received 50mg  benadryl w/ ems.

## 2020-04-05 NOTE — ED Provider Notes (Signed)
[facility] EMERGENCY DEPARTMENT Provider Note   CSN: [id number] Arrival date & time: 04/05/20  1915     History Chief Complaint  Patient presents with  . Allergic Reaction    Meghan Shelton is a 25 y.o. female.  Patient is a 25 year old female with a history of chronic urticaria and seasonal allergic rhinitis who presents with concerns for allergic reaction.  Patient reports that she has been having diarrhea since last night.  She states that today she developed a rash over her back and due to concern for allergic reaction she self administered her EpiPen.  Patient arrives with EMS after EMS was called out to the house.  EMS gave an additional Benadryl IV in route.  Patient denies any shortness of breath, wheezing, swelling of the mouth, or swelling of the throat.  She states that she is been having diarrhea since last night around 8 PM.  She states that her stool is more watery than usual.  She denies any blood in the stool.  She has seen an allergist in the past.  She is allergic to multiple types of grasses.  Patient reports that she believes that her allergic reaction might be secondary to eating red meat.  She has been training for a bodybuilding competition and has not been eating any red meat for the past few weeks.  She states that she recently added that back into her diet since the competition ended on Friday.  She is concerned that adding all of her normal foods back into her diet has caused her allergic reaction.  Patient is not having any trouble breathing.  Patient denies chest pain.  She denies abdominal pain.  Denies nausea and vomiting.        No past medical history on file.  Patient Active Problem List   Diagnosis Date Noted  . Chronic urticaria 09/05/2018  . Seasonal allergic rhinitis due to pollen 09/05/2018    Past Surgical History:  Procedure Laterality Date  . ADENOIDECTOMY    . TONSILLECTOMY    . TYMPANOPLASTY    . TYMPANOSTOMY TUBE  PLACEMENT       OB History   No obstetric history on file.     No family history on file.  Social History   Tobacco Use  . Smoking status: Never Smoker  . Smokeless tobacco: Never Used  Vaping Use  . Vaping Use: Never used  Substance Use Topics  . Alcohol use: Yes    Comment: social  . Drug use: Never    Home Medications Prior to Admission medications   Medication Sig Start Date End Date Taking? Authorizing Provider  diphenhydrAMINE (BENADRYL) 25 MG tablet Take 25 mg by mouth every 6 (six) hours as needed.    [provider]    Allergies    Patient has no known allergies.  Review of Systems   Review of Systems  Constitutional: Negative for chills and fever.  HENT: Negative for ear pain and sore throat.   Eyes: Negative for pain and visual disturbance.  Respiratory: Negative for cough, shortness of breath and wheezing.   Cardiovascular: Negative for chest pain and palpitations.  Gastrointestinal: Positive for diarrhea. Negative for abdominal pain and vomiting.  Genitourinary: Negative for dysuria and hematuria.  Musculoskeletal: Negative for arthralgias and back pain.  Skin: Positive for rash. Negative for color change.  Neurological: Negative for seizures and syncope.  All other systems reviewed and are negative.   Physical Exam Updated Vital Signs BP  109/65   Pulse 63   Temp 97.8 F (36.6 C) (Temporal)   Resp 13   Ht 5\' 3"  (1.6 m)   Wt 54.4 kg   SpO2 98%   BMI 21.26 kg/m   Physical Exam Vitals and nursing note reviewed.  Constitutional:      General: She is not in acute distress.    Appearance: She is well-developed.  HENT:     Head: Normocephalic and atraumatic.     Right Ear: External ear normal.     Left Ear: External ear normal.     Nose: Nose normal.     Mouth/Throat:     Mouth: Mucous membranes are moist.     Pharynx: Oropharynx is clear.     Comments: No evidence for edema or swelling  Eyes:     General:        Right eye: No  discharge.        Left eye: No discharge.     Extraocular Movements: Extraocular movements intact.     Conjunctiva/sclera: Conjunctivae normal.  Cardiovascular:     Rate and Rhythm: Normal rate and regular rhythm.     Heart sounds: No murmur heard.   Pulmonary:     Effort: Pulmonary effort is normal. No respiratory distress.     Breath sounds: Normal breath sounds. No wheezing.  Abdominal:     Palpations: Abdomen is soft.     Tenderness: There is no abdominal tenderness.  Musculoskeletal:     Cervical back: Neck supple.  Skin:    General: Skin is warm and dry.     Findings: Rash present.     Comments: Erythematous flat rash over back, however no urticaria appreciated  Neurological:     Mental Status: She is alert.     ED Results / Procedures / Treatments   Labs (all labs ordered are listed, but only abnormal results are displayed) Labs Reviewed  COMPREHENSIVE METABOLIC PANEL - Abnormal; Notable for the following components:      Result Value   Glucose, Bld 164 (*)    Creatinine, Ser 1.10 (*)    Total Protein 6.0 (*)    All other components within normal limits  CBC WITH DIFFERENTIAL/PLATELET - Abnormal; Notable for the following components:   Neutro Abs 8.5 (*)    Lymphs Abs 0.6 (*)    All other components within normal limits  MAGNESIUM    EKG EKG Interpretation  Date/Time:  Monday April 05 2020 19:24:50 EDT Ventricular Rate:  62 PR Interval:    QRS Duration: 90 QT Interval:  417 QTC Calculation: 424 R Axis:   85 Text Interpretation: Sinus rhythm Short PR interval Confirmed by 1972) on 04/05/2020 8:53:55 PM   Radiology No results found.  Procedures Procedures   Medications Ordered in ED Medications  lactated ringers bolus 1,000 mL (0 mLs Intravenous Stopped 04/05/20 2150)  ondansetron (ZOFRAN) injection 4 mg (4 mg Intravenous Given 04/05/20 2042)  methylPREDNISolone sodium succinate (SOLU-MEDROL) 125 mg/2 mL injection 125 mg (125 mg  Intravenous Given 04/05/20 2042)  famotidine (PEPCID) IVPB 20 mg premix (0 mg Intravenous Stopped 04/05/20 2150)    ED Course  I have reviewed the triage vital signs and the nursing notes.  Pertinent labs & imaging results that were available during my care of the patient were reviewed by me and considered in my medical decision making (see chart for details).    MDM Rules/Calculators/A&P  25 year old female with a history of chronic urticaria and allergies noted per allergist note in the past.  Did receive EpiPen at home.  Received additional 50 IV Benadryl in route with EMS.  On arrival here, patient is in no acute distress.  No wheezing on exam.  No trouble breathing.  No evidence for swelling of the mouth or trouble swallowing. Normal blood pressure. No tachycardia. Patient provided with LR bolus, Solu-Medrol, and Pepcid for allergic reaction anaphylactic.  Patient will be observed after EpiPen administration.  Basic labs ordered to evaluate for electrolyte abnormalities or additional abnormalities given patient's recent body building competition diet.  No further rash development over the course of patient's stay in the emergency department.  Evidence for elevation of creatinine, which is to be expected as patient has been taking multiple supplements and eating only protein in preparation for competition.  Magnesium 1.7 and potassium 3.5.  Will encourage supplementation outpatient setting.  On final reevaluation after 4 hours of observation, patient resting comfortably.  Heart rate in the upper 50s which is patient's baseline.  Blood pressure stable.  She is in no acute distress.  Stable for discharge home at this time.  We will have her follow-up with her primary care provider in the outpatient setting.  Patient reports that she does not need an additional EpiPen at this time.   Final Clinical Impression(s) / ED Diagnoses Final diagnoses:  Allergic reaction, initial  encounter    Rx / DC Orders ED Discharge Orders    None       Kugler, [place], MD 04/06/20 [id number]    Tilden Fossa, MD 04/07/20 [phone]

## 2020-04-05 NOTE — Discharge Instructions (Addendum)
-   Based off of lab work today you will likely benefit from magnesium and potassium supplementation. - Please follow up with your primary care provider

## 2020-04-07 ENCOUNTER — Other Ambulatory Visit: Payer: Self-pay

## 2020-04-07 ENCOUNTER — Observation Stay (HOSPITAL_COMMUNITY)
Admission: EM | Admit: 2020-04-07 | Discharge: 2020-04-08 | Disposition: A | Discharge status: Home / Self Care | Payer: BC Managed Care – PPO | Attending: Critical Care Medicine | Admitting: Critical Care Medicine

## 2020-04-07 DIAGNOSIS — T7809XA Anaphylactic reaction due to other food products, initial encounter: Principal | ICD-10-CM | POA: Diagnosis present

## 2020-04-07 DIAGNOSIS — L509 Urticaria, unspecified: Secondary | ICD-10-CM | POA: Diagnosis present

## 2020-04-07 DIAGNOSIS — Z20822 Contact with and (suspected) exposure to covid-19: Secondary | ICD-10-CM | POA: Diagnosis present

## 2020-04-07 DIAGNOSIS — T783XXA Angioneurotic edema, initial encounter: Secondary | ICD-10-CM | POA: Diagnosis not present

## 2020-04-07 DIAGNOSIS — T782XXA Anaphylactic shock, unspecified, initial encounter: Principal | ICD-10-CM | POA: Diagnosis present

## 2020-04-07 DIAGNOSIS — J302 Other seasonal allergic rhinitis: Secondary | ICD-10-CM | POA: Diagnosis present

## 2020-04-07 DIAGNOSIS — R110 Nausea: Secondary | ICD-10-CM | POA: Insufficient documentation

## 2020-04-07 DIAGNOSIS — R197 Diarrhea, unspecified: Secondary | ICD-10-CM | POA: Insufficient documentation

## 2020-04-07 LAB — CBC WITH DIFFERENTIAL/PLATELET
Abs Immature Granulocytes: 0.05 10*3/uL (ref 0.00–0.07)
Basophils Absolute: 0 10*3/uL (ref 0.0–0.1)
Basophils Relative: 0 %
Eosinophils Absolute: 0 10*3/uL (ref 0.0–0.5)
Eosinophils Relative: 0 %
HCT: 45.1 % (ref 36.0–46.0)
Hemoglobin: 14.5 g/dL (ref 12.0–15.0)
Immature Granulocytes: 1 %
Lymphocytes Relative: 7 %
Lymphs Abs: 0.7 10*3/uL (ref 0.7–4.0)
MCH: 30.2 pg (ref 26.0–34.0)
MCHC: 32.2 g/dL (ref 30.0–36.0)
MCV: 94 fL (ref 80.0–100.0)
Monocytes Absolute: 0.7 10*3/uL (ref 0.1–1.0)
Monocytes Relative: 7 %
Neutro Abs: 8.4 10*3/uL — ABNORMAL HIGH (ref 1.7–7.7)
Neutrophils Relative %: 85 %
Platelets: 217 10*3/uL (ref 150–400)
RBC: 4.8 MIL/uL (ref 3.87–5.11)
RDW: 14.2 % (ref 11.5–15.5)
WBC: 9.8 10*3/uL (ref 4.0–10.5)
nRBC: 0 % (ref 0.0–0.2)

## 2020-04-07 LAB — COMPREHENSIVE METABOLIC PANEL
ALT: 41 U/L (ref 0–44)
ALT: 36 U/L (ref 0–44)
AST: 29 U/L (ref 15–41)
AST: 26 U/L (ref 15–41)
Albumin: 4 g/dL (ref 3.5–5.0)
Albumin: 3.5 g/dL (ref 3.5–5.0)
Alkaline Phosphatase: 36 U/L — ABNORMAL LOW (ref 38–126)
Alkaline Phosphatase: 35 U/L — ABNORMAL LOW (ref 38–126)
Anion gap: 9 (ref 5–15)
Anion gap: 5 (ref 5–15)
BUN: 16 mg/dL (ref 6–20)
BUN: 13 mg/dL (ref 6–20)
CO2: 27 mmol/L (ref 22–32)
CO2: 25 mmol/L (ref 22–32)
Calcium: 9.2 mg/dL (ref 8.9–10.3)
Calcium: 8.5 mg/dL — ABNORMAL LOW (ref 8.9–10.3)
Chloride: 104 mmol/L (ref 98–111)
Chloride: 109 mmol/L (ref 98–111)
Creatinine, Ser: 1.1 mg/dL — ABNORMAL HIGH (ref 0.44–1.00)
Creatinine, Ser: 0.98 mg/dL (ref 0.44–1.00)
GFR, Estimated: >60 mL/min (ref >60)
GFR, Estimated: >60 mL/min (ref >60)
Glucose, Bld: 122 mg/dL — ABNORMAL HIGH (ref 70–99)
Glucose, Bld: 125 mg/dL — ABNORMAL HIGH (ref 70–99)
Potassium: 3.7 mmol/L (ref 3.5–5.1)
Potassium: 3.8 mmol/L (ref 3.5–5.1)
Sodium: 140 mmol/L (ref 135–145)
Sodium: 139 mmol/L (ref 135–145)
Total Bilirubin: 0.7 mg/dL (ref 0.3–1.2)
Total Bilirubin: 0.7 mg/dL (ref 0.3–1.2)
Total Protein: 6.2 g/dL — ABNORMAL LOW (ref 6.5–8.1)
Total Protein: 5.3 g/dL — ABNORMAL LOW (ref 6.5–8.1)

## 2020-04-07 LAB — CBC
HCT: 39.3 % (ref 36.0–46.0)
Hemoglobin: 13.4 g/dL (ref 12.0–15.0)
MCH: 30.4 pg (ref 26.0–34.0)
MCHC: 34.1 g/dL (ref 30.0–36.0)
MCV: 89.1 fL (ref 80.0–100.0)
Platelets: 201 10*3/uL (ref 150–400)
RBC: 4.41 MIL/uL (ref 3.87–5.11)
RDW: 14.3 % (ref 11.5–15.5)
WBC: 11 10*3/uL — ABNORMAL HIGH (ref 4.0–10.5)
nRBC: 0 % (ref 0.0–0.2)

## 2020-04-07 LAB — LIPASE, BLOOD
Lipase: 33 U/L (ref 11–51)
Lipase: 31 U/L (ref 11–51)

## 2020-04-07 LAB — MRSA PCR SCREENING: MRSA by PCR: NEGATIVE

## 2020-04-07 LAB — RESP PANEL BY RT-PCR (FLU A&B, COVID) ARPGX2
Influenza A by PCR: NEGATIVE
Influenza B by PCR: NEGATIVE
SARS Coronavirus 2 by RT PCR: NEGATIVE

## 2020-04-07 LAB — HIV ANTIBODY (ROUTINE TESTING W REFLEX): HIV Screen 4th Generation wRfx: NONREACTIVE

## 2020-04-07 LAB — PROTIME-INR
INR: 1 (ref 0.8–1.2)
Prothrombin Time: 12.6 seconds (ref 11.4–15.2)

## 2020-04-07 LAB — AMYLASE: Amylase: 77 U/L (ref 28–100)

## 2020-04-07 LAB — CORTISOL: Cortisol, Plasma: 11.7 ug/dL

## 2020-04-07 LAB — PHOSPHORUS: Phosphorus: 3.1 mg/dL (ref 2.5–4.6)

## 2020-04-07 LAB — I-STAT BETA HCG BLOOD, ED (MC, WL, AP ONLY): I-stat hCG, quantitative: <5 m[IU]/mL (ref <5)

## 2020-04-07 LAB — MAGNESIUM: Magnesium: 1.7 mg/dL (ref 1.7–2.4)

## 2020-04-07 LAB — APTT: aPTT: 22 seconds — ABNORMAL LOW (ref 24–36)

## 2020-04-07 LAB — PROCALCITONIN: Procalcitonin: <0.1 ng/mL

## 2020-04-07 LAB — GLUCOSE, CAPILLARY: Glucose-Capillary: 120 mg/dL — ABNORMAL HIGH (ref 70–99)

## 2020-04-07 MED ORDER — SODIUM CHLORIDE 0.9 % IV BOLUS
1000.0000 mL | Freq: Once | INTRAVENOUS | Status: AC
  Administered 2020-04-07: 1000 mL via INTRAVENOUS

## 2020-04-07 MED ORDER — PANTOPRAZOLE SODIUM 40 MG IV SOLR
40.0000 mg | Freq: Every day | INTRAVENOUS | Status: DC
  Administered 2020-04-07: 40 mg via INTRAVENOUS
  Filled 2020-04-07: qty 40

## 2020-04-07 MED ORDER — DIPHENHYDRAMINE HCL 50 MG/ML IJ SOLN
25.0000 mg | Freq: Once | INTRAMUSCULAR | Status: AC
  Administered 2020-04-07: 25 mg via INTRAVENOUS
  Filled 2020-04-07: qty 1

## 2020-04-07 MED ORDER — SODIUM CHLORIDE 0.9% FLUSH
3.0000 mL | Freq: Two times a day (BID) | INTRAVENOUS | Status: DC
  Administered 2020-04-08: 3 mL via INTRAVENOUS

## 2020-04-07 MED ORDER — METHYLPREDNISOLONE SODIUM SUCC 125 MG IJ SOLR
125.0000 mg | Freq: Once | INTRAMUSCULAR | Status: AC
  Administered 2020-04-07: 125 mg via INTRAVENOUS
  Filled 2020-04-07: qty 2

## 2020-04-07 MED ORDER — DIPHENHYDRAMINE HCL 25 MG PO CAPS: 50.0000 mg | ORAL_CAPSULE | Freq: Four times a day (QID) | ORAL | Status: DC | PRN

## 2020-04-07 MED ORDER — FAMOTIDINE IN NACL 20-0.9 MG/50ML-% IV SOLN
20.0000 mg | Freq: Once | INTRAVENOUS | Status: AC
  Administered 2020-04-07: 20 mg via INTRAVENOUS
  Filled 2020-04-07: qty 50

## 2020-04-07 MED ORDER — SODIUM CHLORIDE 0.9 % IV SOLN: 250.0000 mL | INTRAVENOUS | Status: DC | PRN

## 2020-04-07 MED ORDER — ONDANSETRON HCL 4 MG/2ML IJ SOLN
4.0000 mg | Freq: Once | INTRAMUSCULAR | Status: AC
  Administered 2020-04-07: 4 mg via INTRAVENOUS
  Filled 2020-04-07: qty 2

## 2020-04-07 MED ORDER — PREDNISONE 20 MG PO TABS
20.0000 mg | ORAL_TABLET | Freq: Two times a day (BID) | ORAL | Status: DC
  Administered 2020-04-07 – 2020-04-08 (×2): 20 mg via ORAL
  Filled 2020-04-07 (×2): qty 1

## 2020-04-07 MED ORDER — EPINEPHRINE 0.3 MG/0.3ML IJ SOAJ
0.3000 mg | Freq: Once | INTRAMUSCULAR | Status: AC
  Administered 2020-04-07: 0.3 mg via INTRAMUSCULAR
  Filled 2020-04-07: qty 0.3

## 2020-04-07 MED ORDER — SODIUM CHLORIDE 0.9% FLUSH: 3.0000 mL | INTRAVENOUS | Status: DC | PRN

## 2020-04-07 MED ORDER — FAMOTIDINE 20 MG PO TABS
20.0000 mg | ORAL_TABLET | Freq: Every day | ORAL | Status: AC
  Administered 2020-04-07: 20 mg via ORAL
  Filled 2020-04-07: qty 1

## 2020-04-07 MED ORDER — POLYETHYLENE GLYCOL 3350 17 G PO PACK: 17.0000 g | PACK | Freq: Every day | ORAL | Status: DC | PRN

## 2020-04-07 MED ORDER — EPINEPHRINE HCL 5 MG/250ML IV SOLN IN NS
0.5000 ug/min | INTRAVENOUS | Status: DC
  Filled 2020-04-07: qty 250

## 2020-04-07 MED ORDER — DOCUSATE SODIUM 100 MG PO CAPS: 100.0000 mg | ORAL_CAPSULE | Freq: Two times a day (BID) | ORAL | Status: DC | PRN

## 2020-04-07 MED ORDER — EPINEPHRINE 0.3 MG/0.3ML IJ SOAJ
0.3000 mg | Freq: Every day | INTRAMUSCULAR | Status: DC | PRN
  Filled 2020-04-07: qty 0.6

## 2020-04-07 NOTE — ED Notes (Signed)
Pt out of bathroom, c/o feeling hot and hives. Pt is flushed, hives to posterior legs and itching.

## 2020-04-07 NOTE — ED Notes (Signed)
PA-C Layden aware and at bedside

## 2020-04-07 NOTE — Plan of Care (Signed)

## 2020-04-07 NOTE — ED Notes (Signed)
Pt transferred to ICU with nurse

## 2020-04-07 NOTE — ED Notes (Signed)
Minor NP from critical care team at bedside and requested to hold Epi drip at this time

## 2020-04-07 NOTE — ED Notes (Signed)
Pt is decreasing with symptoms at this time, no respiratory involvement, hives improving

## 2020-04-07 NOTE — ED Provider Notes (Signed)
[facility] EMERGENCY DEPARTMENT Provider Note   CSN: [id number] Arrival date & time: 04/07/20  3875     History Chief Complaint  Patient presents with  . Allergic Reaction  . Nausea  . Emesis  . Diarrhea    Meghan Shelton is a 25 y.o. female BIB EMS for evaluation of allergic reaction. She reports that at about 11:30 pm this evening she started having nausea/vomiting/diarrhea. At about 5:30 am, she developed chest pain, hives and difficulty breathing. She also felt like her throat was closing. After these symptoms, she administered her epipen and called EMS. EMS gave her PO benadryl (50mg ). No hypotension with EMS. She reports a history of allergic reactions and was seen in the ED on 04/05/20 for similar symptoms. She has been evaluated by an allergist to determine the source of her allergies and was supposed to get tested for alpha gal today. She reports that since being in the ED her CP and SOB and have improved but she still has some hives and itching.    The history is provided by the patient.       No past medical history on file.  Patient Active Problem List   Diagnosis Date Noted  . Anaphylactic shock 04/07/2020  . Chronic urticaria 09/05/2018  . Seasonal allergic rhinitis due to pollen 09/05/2018    Past Surgical History:  Procedure Laterality Date  . ADENOIDECTOMY    . TONSILLECTOMY    . TYMPANOPLASTY    . TYMPANOSTOMY TUBE PLACEMENT       OB History   No obstetric history on file.     No family history on file.  Social History   Tobacco Use  . Smoking status: Never Smoker  . Smokeless tobacco: Never Used  Vaping Use  . Vaping Use: Never used  Substance Use Topics  . Alcohol use: Yes    Comment: social  . Drug use: Never    Home Medications Prior to Admission medications   Medication Sig Start Date End Date Taking? Authorizing Provider  diphenhydrAMINE (BENADRYL) 25 MG tablet Take 25 mg by mouth as needed for allergies.    Yes [provider]  EPINEPHrine 0.3 mg/0.3 mL IJ SOAJ injection Inject 0.3 mg into the muscle as needed for anaphylaxis. 12/19/19  Yes [provider]    Allergies    Gramineae pollens  Review of Systems   Review of Systems  Constitutional: Negative for fever.  Respiratory: Positive for chest tightness and shortness of breath. Negative for cough.   Cardiovascular: Negative for chest pain.  Gastrointestinal: Positive for diarrhea, nausea and vomiting. Negative for abdominal pain.  Genitourinary: Negative for dysuria and hematuria.  Neurological: Negative for headaches.  All other systems reviewed and are negative.   Physical Exam Updated Vital Signs BP (!) 102/42   Pulse (!) 50   Temp 98.2 F (36.8 C) (Oral)   Resp 18   SpO2 99%   Physical Exam Vitals and nursing note reviewed.  Constitutional:      Appearance: Normal appearance. She is well-developed.  HENT:     Head: Normocephalic and atraumatic.     Mouth/Throat:     Comments: No oral angioedema.  Eyes:     General: Lids are normal.     Conjunctiva/sclera: Conjunctivae normal.     Pupils: Pupils are equal, round, and reactive to light.  Cardiovascular:     Rate and Rhythm: Normal rate and regular rhythm.     Pulses: Normal pulses.  Heart sounds: Normal heart sounds. No murmur heard. No friction rub. No gallop.   Pulmonary:     Effort: Pulmonary effort is normal.     Breath sounds: Normal breath sounds.     Comments: Lungs clear to auscultation bilaterally.  Symmetric chest rise.  No wheezing, rales, rhonchi. Abdominal:     Palpations: Abdomen is soft. Abdomen is not rigid.     Tenderness: There is no abdominal tenderness. There is no guarding.     Comments: Abdomen is soft, non-distended, non-tender. No rigidity, No guarding. No peritoneal signs.  Musculoskeletal:        General: Normal range of motion.     Cervical back: Full passive range of motion without pain.  Skin:    General: Skin  is warm and dry.     Capillary Refill: Capillary refill takes less than 2 seconds.  Neurological:     Mental Status: She is alert and oriented to person, place, and time.  Psychiatric:        Speech: Speech normal.     ED Results / Procedures / Treatments   Labs (all labs ordered are listed, but only abnormal results are displayed) Labs Reviewed  CBC WITH DIFFERENTIAL/PLATELET - Abnormal; Notable for the following components:      Result Value   Neutro Abs 8.4 (*)    All other components within normal limits  COMPREHENSIVE METABOLIC PANEL - Abnormal; Notable for the following components:   Glucose, Bld 122 (*)    Creatinine, Ser 1.10 (*)    Total Protein 6.2 (*)    Alkaline Phosphatase 36 (*)    All other components within normal limits  RESP PANEL BY RT-PCR (FLU A&B, COVID) ARPGX2  LIPASE, BLOOD  HIV ANTIBODY (ROUTINE TESTING W REFLEX)  COMPREHENSIVE METABOLIC PANEL  MAGNESIUM  PHOSPHORUS  PROCALCITONIN  CORTISOL  CBC  PROTIME-INR  APTT  AMYLASE  LIPASE, BLOOD  I-STAT BETA HCG BLOOD, ED ([facility], [facility], [facility] ONLY)    EKG EKG Interpretation  Date/Time:  Wednesday April 07 2020 06:28:43 EDT Ventricular Rate:  59 PR Interval:    QRS Duration: 96 QT Interval:  430 QTC Calculation: 426 R Axis:   82 Text Interpretation: Sinus rhythm Borderline short PR interval Borderline repolarization abnormality When compared with ECG of 04/05/2020, No significant change was found Confirmed by Dione BoozeGlick, David ([id number]) on 04/07/2020 6:32:52 AM   Radiology No results found.  Procedures .Critical Care Performed by: Maxwell CaulLayden, Lindsey A, PA-C Authorized by: Maxwell CaulLayden, Lindsey A, PA-C   Critical care provider statement:    Critical care time (minutes):  35   Critical care time was exclusive of:  Separately billable procedures and treating other patients   Critical care was necessary to treat or prevent imminent or life-threatening deterioration of the following conditions: Anaphylaxis.   Critical  care was time spent personally by me on the following activities:  Discussions with consultants, evaluation of patient's response to treatment, examination of patient, ordering and performing treatments and interventions, ordering and review of laboratory studies, ordering and review of radiographic studies, pulse oximetry, re-evaluation of patient's condition, obtaining history from patient or surrogate and review of old charts     Medications Ordered in ED Medications  docusate sodium (COLACE) capsule 100 mg (has no administration in time range)  polyethylene glycol (MIRALAX / GLYCOLAX) packet 17 g (has no administration in time range)  pantoprazole (PROTONIX) injection 40 mg (has no administration in time range)  sodium chloride flush (NS) 0.9 % injection 3  mL (has no administration in time range)  sodium chloride flush (NS) 0.9 % injection 3 mL (has no administration in time range)  0.9 %  sodium chloride infusion (has no administration in time range)  EPINEPHrine (EPI-PEN) injection 0.3 mg (has no administration in time range)  famotidine (PEPCID) tablet 20 mg (has no administration in time range)  predniSONE (DELTASONE) tablet 20 mg (has no administration in time range)  diphenhydrAMINE (BENADRYL) capsule 50 mg (has no administration in time range)  methylPREDNISolone sodium succinate (SOLU-MEDROL) 125 mg/2 mL injection 125 mg (125 mg Intravenous Given 04/07/20 0755)  famotidine (PEPCID) IVPB 20 mg premix (0 mg Intravenous Stopped 04/07/20 0829)  sodium chloride 0.9 % bolus 1,000 mL (0 mLs Intravenous Stopped 04/07/20 0835)  ondansetron (ZOFRAN) injection 4 mg (4 mg Intravenous Given 04/07/20 0756)  diphenhydrAMINE (BENADRYL) injection 25 mg (25 mg Intravenous Given 04/07/20 0833)  EPINEPHrine (EPI-PEN) injection 0.3 mg (0.3 mg Intramuscular Given 04/07/20 0837)  sodium chloride 0.9 % bolus 1,000 mL (0 mLs Intravenous Stopped 04/07/20 1050)    ED Course  I have reviewed the triage vital  signs and the nursing notes.  Pertinent labs & imaging results that were available during my care of the patient were reviewed by me and considered in my medical decision making (see chart for details).    MDM Rules/Calculators/A&P                          25 year old female who presents for evaluation of allergic reaction.  History of allergies and is working with her allergist to determine the source.  Was seen here on 04/05/2020 for evaluation of same symptoms.  Had 2 occurrences during that time.  Was discharged home.  She reports that last night at 1130, she started having nausea, vomiting, diarrhea.  At 530 this morning, she started have chest pain, difficulty breathing and so she did a EpiPen.  EMS came in gave Benadryl.  On EMS arrival, patient did not have any hypotension.  On initial arrival, patient is afebrile, nontoxic-appearing.  Initial provider saw her for an MSE and noted some erythema, rash.  She was given Solu-Medrol, Pepcid.  We will plan to monitor here in the ED.  Reevaluation.  Patient still having diarrhea.  She does feel like there is some chest tightness.  We will continue to monitor.  Lipase normal.  I-STAT beta negative.  CBC shows no leukocytosis or anemia.  CMP shows creatinine of 1.10.  Otherwise unremarkable.  8:30 AM: RN informed me that patient was having additional symptoms.  I went evaluated the patient.  She had erythema diffusely as well as a small rash noted to her posterior thighs.  She felt like she was having some trouble breathing and felt like her throat was tight.  On my evaluation, her oxygenation status was 98% on room air.  No oral angioedema.  She did have some erythema, rash noted to posterior thighs.  We will plan additional Benadryl, EpiPen.  Patient slightly hypotensive.  Will give additional fluids.  Given that this is been a second recurrence in less than 12 hours additionally while on epi as well as the fact that this has been the fourth occurrence  in the last few days, feel that this warrants observation for admission.  Patient and mom are in agreement to plan.  Patient's blood pressure initially was in the 120s over 62.  She did have episode where she was 120/57 and started  downtrending.  Second liter of fluids was ordered.  Her current blood pressure is now 96/40 with a MAP of 55.  I discussed with the medicine team.  Given her hypotension now with the lower back, they recommend ICU admission.  I will start patient on epi drip.  Discussed with ICU who will accept patient.  Portions of this note were generated with Scientist, clinical (histocompatibility and immunogenetics). Dictation errors may occur despite best attempts at proofreading.   Final Clinical Impression(s) / ED Diagnoses Final diagnoses:  Anaphylaxis, initial encounter    Rx / DC Orders ED Discharge Orders    None       Rosana Hoes 04/07/20 1237    Blane Ohara, MD 04/08/20 1505

## 2020-04-07 NOTE — H&P (Signed)
NAME:  Meghan Shelton, MRN:  [id number], DOB:  [birth year], LOS: 0 ADMISSION DATE:  04/07/2020, CONSULTATION DATE: 04/07/2020 REFERRING MD: Emergency department physician  CHIEF COMPLAINT: Respiratory distress, hypotension, urticaria from suspected anaphylactic reaction.  Brief History:  Seen twice in the emergency room for worsening respiratory distress and hypotension from suspected allergic reaction creating anaphylaxis.  History of Present Illness:  25 year old female with a history of chronic [employer] is been evaluated by allergy.  Over the last 11 months is increase in the frequency of you to [place] and over the last 48 hours had 2 episodes of questionable anaphylaxis with shortness of breath tachypnea and hives to the lower legs and back.  She was seen in the emergency room 04/05/2020 treated with epinephrine H2 blockers steroids and discharged home.  She returns 04/07/2020 with similar symptoms also with nausea vomiting diarrhea and reported difficulty breathing.  She was treated with 2 doses of epinephrine along with H2 blocker steroids and had improved.  She was to be admitted to teaching service at which time she developed again hypotension and difficulty breathing was given a second dose of epinephrine IM.  Epinephrine drip IV was ordered pulmonary critical care was called to the bedside at that time she was stable.  The epinephrine drip was placed on hold.  Due to the fact she has been admitted to the hospital twice in 48 hours of worsening symptoms she will be admitted to the intensive care unit for further evaluation and treatment.  Note she is followed by local allergist and other than me and positive for reaction to grass there were no positive allergens of note.  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  She notes every time she comes off  training episode and she likes hamburgers ham and cheese and pork. may not be the causative factor but this needs to be further worked up.  Note her alpha-gal panel on 8-13 -2020 was essentially negative.  Repeating this panel at this time due to the fact she has had steroids and multiple medications may not be revealing.  She will need to be follow-up with allergist as an outpatient basis goal of admission is to monitor closely for 24 hours to ensure she does not have a true anaphylactic shock. Past Medical History:  Seasonal allergies Chronic urticaria followed by allergist specialist  Significant Hospital Events:  Has been admitted to the hospital twice in 48 hours with signs and symptoms of anaphylactic shock.  Consults:    Procedures:    Significant Diagnostic Tests:    Micro Data:  None  Antimicrobials:  None  Interim History / Subjective:  25 year old female he was treated with epinephrine x2 for presumed anaphylactic shock from unknown agent he was now stabilized and will be admitted to the intensive care unit for further evaluation and treatment and close monitoring for at least 24 hours.  Objective   Blood pressure (!) 101/41, pulse (!) 54, temperature 98.2 F (36.8 C), temperature source Oral, resp. rate 16, SpO2 98 %.       No intake or output data in the 24 hours ending 04/07/20 1159 There were no vitals filed for this visit.  Examination: General: 25 year old female in no acute distress HENT: Oral pharynx unremarkable, no evidence of angioedema is appreciated Lungs: Clear to auscultation Cardiovascular: Sounds are regular regular rate  and rhythm with a rate of 74 Abdomen: Soft nontender positive bowel sounds Extremities: Notable for having topical chanting solution applied recently for training Neuro: Grossly intact without focal defect GU: Reports voiding without issue  Resolved Hospital Problem list     Assessment & Plan:  Anaphylactic shock in the  setting of multiple reactions over the last year with increasing intensity most likely secondary to either dietary concerns in a patient who has known allergic reactions to grasses.  She is required multiple doses of epinephrine IM and was on the verge of have an epinephrine drip IV.   As needed order for epinephrine. Oral steroids H2 blockers Admit to the intensive care unit overnight for careful observation Close monitoring along with cardiac monitoring.  Best practice (evaluated daily)  Diet: Regular diet Pain/Anxiety/Delirium protocol (if indicated): None indicated VAP protocol (if indicated): Not indicated DVT prophylaxis: Pneumatic stop GI prophylaxis: H2 blocker Glucose control: Not indicated Mobility: Out of bed as tolerated Disposition: Intensive care unit for close monitoring  Goals of Care:  Last date of multidisciplinary goals of care discussion.  04/07/2024 Family and staff present: Patient mother and nurse practitioner Summary of discussion: Admit to the hospital for least 24 hours for close observation Follow up goals of care discussion due: She is obviously a full code at age 74 Code Status: Full code  Labs   CBC: Recent Labs  Lab 04/05/20 1940 04/07/20 0759  WBC 9.5 9.8  NEUTROABS 8.5* 8.4*  HGB 15.0 14.5  HCT 45.6 45.1  MCV 91.9 94.0  PLT 207 217    Basic Metabolic Panel: Recent Labs  Lab 04/05/20 1940 04/07/20 0759  NA 138 140  K 3.5 3.7  CL 107 104  CO2 23 27  GLUCOSE 164* 122*  BUN 14 16  CREATININE 1.10* 1.10*  CALCIUM 8.9 9.2  MG 1.7  --    GFR: Estimated Creatinine Clearance: 65.8 mL/min (A) (by C-G formula based on SCr of 1.1 mg/dL (H)). Recent Labs  Lab 04/05/20 1940 04/07/20 0759  WBC 9.5 9.8    Liver Function Tests: Recent Labs  Lab 04/05/20 1940 04/07/20 0759  AST 31 29  ALT 28 41  ALKPHOS 38 36*  BILITOT 1.0 0.7  PROT 6.0* 6.2*  ALBUMIN 4.0 4.0   Recent Labs  Lab 04/07/20 0759  LIPASE 33   No results for  input(s): AMMONIA in the last 168 hours.  ABG No results found for: PHART, PCO2ART, PO2ART, HCO3, TCO2, ACIDBASEDEF, O2SAT   Coagulation Profile: No results for input(s): INR, PROTIME in the last 168 hours.  Cardiac Enzymes: No results for input(s): CKTOTAL, CKMB, CKMBINDEX, TROPONINI in the last 168 hours.  HbA1C: No results found for: HGBA1C  CBG: No results for input(s): GLUCAP in the last 168 hours.  Review of Systems:   10 point review of system taken, please see HPI for positives and negatives.   Past Medical History:  She,  has no past medical history on file.   Surgical History:   Past Surgical History:  Procedure Laterality Date  . ADENOIDECTOMY    . TONSILLECTOMY    . TYMPANOPLASTY    . TYMPANOSTOMY TUBE PLACEMENT       Social History:   reports that she has never smoked. She has never used smokeless tobacco. She reports current alcohol use. She reports that she does not use drugs.   Family History:  Her family history is not on file.   Allergies Allergies  Allergen Reactions  .  Gramineae Pollens Anaphylaxis     Home Medications  Prior to Admission medications   Medication Sig Start Date End Date Taking? Authorizing Provider  diphenhydrAMINE (BENADRYL) 25 MG tablet Take 25 mg by mouth as needed for allergies.   Yes [provider]  EPINEPHrine 0.3 mg/0.3 mL IJ SOAJ injection Inject 0.3 mg into the muscle as needed for anaphylaxis. 12/19/19  Yes [provider]     Critical care time: 35 min       Brett Canales Dylin Breeden ACNP Acute Care Nurse Practitioner [facility] Pulmonary/Critical Care Please consult Amion 04/07/2020, 11:59 AM

## 2020-04-07 NOTE — ED Provider Notes (Addendum)
MSE was initiated and I personally evaluated the patient and placed orders (if any) at  6:28 AM on April 07, 2020.  The patient appears stable so that the remainder of the MSE may be completed by another provider.   Patient presents via EMS for anaphylactic reaction.  Patient reports around 11:30 PM last night she developed nausea vomiting and diarrhea and thin around 5:30 AM developed chest pain, hives, difficulty breathing, itching of her hands and feet.  She was administered an EpiPen at 5:35 AM.  EMS was contacted and they gave 50 mg of p.o. Benadryl.  No hypotension with EMS.  Patient was seen on Monday, 04/05/2020 for similar symptoms and had return of allergic reaction while here in the emergency department.  She has followed with her allergist who requested to be tested for alpha gal and she was to be here at [facility] for that today.  Patient reports her chest pain has improved along with her shortness of breath.  She continues to have hives.  Face to face Exam:   General: Awake  HEENT: Atraumatic, uvula midline, no swelling of the uvula Resp: Normal effort, no wheezing Abd: Nondistended, nontender Neuro:No focal weakness  Lymph: No adenopathy Skin: Hives covering arms, legs, torso and back  BP (!) 107/53   Pulse 61   Temp 98.2 F (36.8 C) (Oral)   Resp 13   SpO2 100%    6:32 AM Medications ordered including a Solu-Medrol, Pepcid and basic blood work.  Patient may need admission for recurrent anaphylaxis.      Muthersbaugh, Boyd Kerbs 04/07/20 2353    Dione Booze, MD 04/07/20 [phone]

## 2020-04-07 NOTE — Clinical Note (Incomplete)
   NAME:  Meghan Shelton, MRN:  [id number], DOB:  [birth year], LOS: 0 ADMISSION DATE:  04/07/2020, CONSULTATION DATE:  *** REFERRING MD:  ***, CHIEF COMPLAINT:  ***   History of present illness   ***  Past Medical History  ***  Significant Hospital Events  Including procedures, antibiotic start and stop dates in addition to other pertinent events   .    Interim history/subjective:  ***  Objective   Blood pressure (Abnormal) 114/58, pulse 79, temperature 98.8 F (37.1 C), temperature source Oral, resp. rate 20, height 5\' 3"  (1.6 m), weight 57.6 kg, SpO2 98 %.        Intake/Output Summary (Last 24 hours) at 04/07/2020 1746 Last data filed at 04/07/2020 1400 Gross per 24 hour  Intake no documentation  Output 1 ml  Net -1 ml   Filed Weights   04/07/20 1320  Weight: 57.6 kg    Examination: General: *** HENT: *** Lungs: *** Cardiovascular: *** Abdomen: *** Extremities: *** Neuro: *** GU: ***  Labs/imaging personally reviewed   *** Resolved Hospital Problem list   ***  Assessment & Plan:  ***  Best practice (evaluated daily)   Diet:  04/09/20 Pain/Anxiety/Delirium protocol (if indicated): {Pain/Anxiety/Delirium:26941} VAP protocol (if indicated): {VAP:29640} DVT prophylaxis: {DVT Prophylaxis:26933} GI prophylaxis: {GI:26934} Glucose control:  {Glucose Control:26935} Central venous access:  {Central Venous Access:26936} Arterial line:  {Central Venous Access:26936} Foley:  {Central Venous Access:26936} Mobility:  {Mobility:26937}  PT consulted: {PT Consult:26938} Last date of multidisciplinary goals of care discussion [***] Code Status:  {Code Status:26939} Disposition: ***     Labs   Reviewed  Review of Systems:   ***  Past Medical History  She,  has no past medical history on file.   Surgical History    Past Surgical History:  Procedure Laterality Date  . ADENOIDECTOMY    . TONSILLECTOMY    . TYMPANOPLASTY    . TYMPANOSTOMY TUBE  PLACEMENT       Social History   reports that she has never smoked. She has never used smokeless tobacco. She reports current alcohol use. She reports that she does not use drugs.   Family History   Her family history is not on file.   Allergies Allergies  Allergen Reactions  . Gramineae Pollens Anaphylaxis     Home Medications  Prior to Admission medications   Medication Sig Start Date End Date Taking? Authorizing Provider  diphenhydrAMINE (BENADRYL) 25 MG tablet Take 25 mg by mouth as needed for allergies.   Yes [provider]  EPINEPHrine 0.3 mg/0.3 mL IJ SOAJ injection Inject 0.3 mg into the muscle as needed for anaphylaxis. 12/19/19  Yes [provider]     Critical care time: ***

## 2020-04-07 NOTE — ED Notes (Signed)
Walked patient to the bathroom patient did well 

## 2020-04-07 NOTE — ED Triage Notes (Signed)
Pt BIB EMS for N/V/D and allergic reaction. PT reports last night she started having N/V/D every 20-30min and around 5am she starting feeling burning in her feet and hives on the inside of her thighs. She gave herself an Epi pen at 0515 and that helped resolve some symptoms. EMS reports BP 100/60, 100% RA RR18. EMS gave 50 of benadryl PO.

## 2020-04-07 NOTE — ED Notes (Signed)
Pt was seen here on Monday for same thing. Had a relapse here and was given steroids. Had all the same symptoms on Monday as now.

## 2020-04-07 NOTE — ED Notes (Signed)
Will continue to monitor pt with vital signs, pt is asymptomatic at this time.

## 2020-04-08 DIAGNOSIS — T782XXA Anaphylactic shock, unspecified, initial encounter: Secondary | ICD-10-CM

## 2020-04-08 LAB — BASIC METABOLIC PANEL
Anion gap: 8 (ref 5–15)
BUN: 16 mg/dL (ref 6–20)
CO2: 26 mmol/L (ref 22–32)
Calcium: 9.2 mg/dL (ref 8.9–10.3)
Chloride: 106 mmol/L (ref 98–111)
Creatinine, Ser: 1.01 mg/dL — ABNORMAL HIGH (ref 0.44–1.00)
GFR, Estimated: >60 mL/min (ref >60)
Glucose, Bld: 149 mg/dL — ABNORMAL HIGH (ref 70–99)
Potassium: 4 mmol/L (ref 3.5–5.1)
Sodium: 140 mmol/L (ref 135–145)

## 2020-04-08 MED ORDER — EPINEPHRINE 0.3 MG/0.3ML IJ SOAJ
0.3000 mg | Freq: Every day | INTRAMUSCULAR | 0 refills | Status: AC | PRN
Start: 2020-04-08 — End: ?

## 2020-04-08 MED ORDER — LORATADINE 10 MG PO TABS: 10.0000 mg | ORAL_TABLET | Freq: Every day | ORAL | 2 refills | Status: AC

## 2020-04-08 MED ORDER — EPINEPHRINE 0.3 MG/0.3ML IJ SOAJ: 0.3000 mg | Freq: Every day | INTRAMUSCULAR | 0 refills | Status: DC | PRN

## 2020-04-08 MED ORDER — PREDNISONE 20 MG PO TABS
20.0000 mg | ORAL_TABLET | Freq: Two times a day (BID) | ORAL | 0 refills | Status: AC
Start: 2020-04-08 — End: 2020-04-13

## 2020-04-08 MED ORDER — CHLORHEXIDINE GLUCONATE CLOTH 2 % EX PADS: 6.0000 | MEDICATED_PAD | Freq: Every day | CUTANEOUS | Status: DC

## 2020-04-08 MED ORDER — FAMOTIDINE 20 MG PO TABS: 20.0000 mg | ORAL_TABLET | Freq: Two times a day (BID) | ORAL | 0 refills | Status: AC

## 2020-04-08 MED ORDER — EPINEPHRINE 0.3 MG/0.3ML IJ SOAJ: 0.3000 mg | INTRAMUSCULAR | 2 refills | Status: DC | PRN

## 2020-04-08 NOTE — Plan of Care (Signed)

## 2020-04-08 NOTE — Discharge Instructions (Signed)
Anaphylactic Reaction, Adult An anaphylactic reaction (anaphylaxis) is a sudden, serious allergic reaction. This affects more than one part of your body. It can be life-threatening. If you have an anaphylactic reaction, you need to get medical help right away. What are the causes? This condition is caused by exposure to things that give you an allergic reaction (allergens). Common allergens include:  Foods, such as peanuts, wheat, shellfish, milk, and eggs.  Medicines.  Insect bites or stings.  Blood or parts of blood received for treatment (transfusions).  Chemicals, such as latex and dyes that are used in food and in medical tests. What are the signs or symptoms? Signs of an anaphylactic reaction may include:  Feeling warm in the face (flushed). Your face may turn red.  Itchy, red, swollen areas of skin (hives).  Swelling of the: ? Eyes. ? Lips. ? Face. ? Mouth. ? Tongue. ? Throat.  Trouble with any of these: ? Breathing. ? Talking. ? Swallowing.  Loud breathing (wheezing).  Feeling dizzy or light-headed.  Passing out (fainting).  Pain or cramps in your belly.  Throwing up (vomiting).  Watery poop (diarrhea). How is this diagnosed? This condition is diagnosed based on:  Your symptoms.  A physical exam.  Blood tests.  Recent exposure to things that give you an allergic reaction. How is this treated? If you think you are having an anaphylactic reaction, you should do this right away:  Give yourself a shot of medicine (epinephrine) using an auto-injector "pen." Your doctor will teach you how to use this pen.  Call for emergency help. If you use a pen, you must still get treated in the hospital. There, you may be given: ? Medicines. ? Oxygen. ? Fluids in an IV tube. Follow these instructions at home: Safety  Always keep an auto-injector pen with you. This could save your life. Use it as told by your doctor.  Do not drive after a reaction. Wait until  your doctor says it is safe to drive.  Make sure that you, the people who live with you, and your employer know: ? What you are allergic to, so you can stay away from it. ? How to use your auto-injector pen.  Wear a bracelet or necklace that says you have an allergy, if your doctor tells you to do this.  Learn the signs of a very bad allergic reaction. This way, you can treat it right away.  Work with your doctors to make a plan for what to do if you have a very bad reaction. It is important to be ready. If you use your auto-injector pen:  Get more medicine (epinephrine) for your pen right away. This is important in case you have another reaction.  Get help right away.   To avoid a serious allergic reaction:  Avoid things that gave you a very bad allergic reaction before.  Tell your server about your allergy when you go out to eat. If you are not sure if your meal has food that you are allergic to, ask your server before you eat it. General instructions  Take over-the-counter and prescription medicines only as told by your doctor.  If you have itchy, red, swollen areas of skin or a rash: ? Use an over-the-counter medicine (antihistamine) as told by your doctor. ? Put cold, wet cloths on your skin. ? Take a cool bath or shower. Avoid hot water.  Tell all doctors who care for you that you have an allergy.  Keep all follow-up   visits as told by your doctor. This is important. Get help right away if:  You have signs of an allergic reaction. You may notice them soon after being exposed to things that give you an allergic reaction. Signs may include: ? Warmth in your face. Your face may turn red. ? Itchy, red, swollen areas of skin. ? Swelling of your:  Eyes.  Lips.  Face.  Mouth.  Tongue.  Throat. ? Trouble with any of these:  Breathing.  Talking.  Swallowing. ? Loud breathing (wheezing). ? Feeling dizzy or light-headed. ? Passing out. ? Pain or cramps in your  belly. ? Throwing up. ? Watery poop.  You had to use your auto-injector pen. You must go to the emergency room even if the medicine seems to be working. This is because another allergic reaction may happen within 3 days (rebound anaphylaxis). These symptoms may be an emergency. Do not wait to see if the symptoms will go away. Do this right away:  Use your auto-injector pen as you have been told.  Get medical help. Call your local emergency services (911 in the [place] Do not drive yourself to the hospital. Summary  An anaphylactic reaction (anaphylaxis) is a sudden, serious allergic reaction.  This condition can be life-threatening. If you have a reaction, get medical help right away.  Your doctor will show you how to give yourself a shot (epinephrine injection) with an auto-injector "pen."  Always keep an auto-injector pen with you. It could save your life. Use it as told by your doctor.  If you had to use your auto-injector pen, you must go to the emergency room. Go there even if the medicine seems to be working. This information is not intended to replace advice given to you by your health care provider. Make sure you discuss any questions you have with your health care provider. Document Revised: 06/12/2018 Document Reviewed: 05/03/2017 Elsevier Patient Education  2021 [employer]  

## 2020-04-08 NOTE — Discharge Summary (Signed)
Physician Discharge Summary  Patient ID: Meghan Shelton MRN: [id number] DOB/AGE: 08-31-1995 24 y.o.  Admit date: 04/07/2020 Discharge date: 04/08/2020  Admission Diagnoses: Anaphylactic shock  Discharge Diagnoses:  Active Problems:   Anaphylactic shock   Discharged Condition: Stable  Hospital Course:  Meghan Shelton is a 25 year old female with a history of chronic urticaria who presented to the ED on 3/16 for the second time in the last 3 days for diarrhea, abdominal pain, uticaria and found to have mild upper airway congestion consistent with grade 2 anaphylaxis. She received steroids and antihistamines in the ED with resolution of her symptoms. She was admitted to the ICU for observation overnight due to this being her second episode of anaphylaxis in the last 3 days. Patient had no wheezing or upper airway stridor or evidence of angioedema on exam. On 3/17, patient was stable for discharge. Patient's allergist was informed of patient's hospitalization and she recommended checking for alpha gal and tryptase before discharge with plan for close follow-up in the outpatient. Patient instructed to avoid red meat and seafood and call her allergist office to schedule a hospital follow up appt this week.   Consults: Pulmonary and Critical Care Medicine  Significant Diagnostic Studies: Pending labs for follow up: Alpha galactosidase, tryptase  Treatments: Prednisone, Epinephrine, benadryl, Pepcid   Discharge Exam: Blood pressure 107/60, pulse (!) 55, temperature 97.9 F (36.6 C), temperature source Oral, resp. rate 16, height 5\' 3"  (1.6 m), weight 57.9 kg, SpO2 100 %.   Examination: General: Pleasant young woman laying in bed in no acute distress HEENT: [place]/AT. MMM. No evidence of angioedema. Lungs: CTAB. No wheezing or rales. Normal effort.  Cardiovascular: RRR. No m/r/g. No LE edema. Abdomen: Soft. NT/ND. Normal BS.  Extremities: Well- perfused Skin: Residual urticaria rash on  back.  Neuro: A&Ox3. No focal deficits.    Disposition: Discharge home today   Allergies as of 04/08/2020      Reactions   Gramineae Pollens Anaphylaxis      Medication List    TAKE these medications   diphenhydrAMINE 25 MG tablet Commonly known as: BENADRYL Take 25 mg by mouth as needed for allergies.   EPINEPHrine 0.3 mg/0.3 mL Soaj injection Commonly known as: EPI-PEN Inject 0.3 mg into the muscle daily as needed for anaphylaxis (for acute anaphlatic shock). What changed:   when to take this  reasons to take this   famotidine 20 MG tablet Commonly known as: PEPCID Take 1 tablet (20 mg total) by mouth 2 (two) times daily.   loratadine 10 MG tablet Commonly known as: Claritin Take 1 tablet (10 mg total) by mouth daily.   predniSONE 20 MG tablet Commonly known as: DELTASONE Take 1 tablet (20 mg total) by mouth 2 (two) times daily with a meal for 5 days.      Patient instruction: --Avoid red meat and seafood --Pick up EpiPen from your local pharmacy --Call and schedule an appointment with yor allergist --Go to [facility] to get the rest of your labs drawn  Signed: 04/10/2020 04/08/2020, 9:21 AM

## 2020-04-08 NOTE — Progress Notes (Signed)
Gave discharge instructions to [facility]. Mom and fiance at bedside. Patient belongings (cell phone and charger) were taken with patient. Patient did not want a wheelchair for discharge. No distress noted at this time.

## 2020-04-10 LAB — TRYPTASE: Tryptase: 5.3 ug/L (ref 2.2–13.2)

## 2020-04-19 LAB — ALPHA GALACTOSIDASE: Alpha galactosidase, serum: 45.8 nmol/hr/mg prt (ref >35.5)
